# Patient Record
Sex: Female | Born: 1970
Health system: Southern US, Community
[De-identification: ages and names within clinical notes are randomized; demographics above are authoritative.]

## PROBLEM LIST (undated history)

## (undated) DIAGNOSIS — E7800 Pure hypercholesterolemia, unspecified: Secondary | ICD-10-CM

## (undated) DIAGNOSIS — I10 Essential (primary) hypertension: Secondary | ICD-10-CM

## (undated) DIAGNOSIS — J449 Chronic obstructive pulmonary disease, unspecified: Secondary | ICD-10-CM

## (undated) DIAGNOSIS — D649 Anemia, unspecified: Secondary | ICD-10-CM

## (undated) DIAGNOSIS — G4730 Sleep apnea, unspecified: Secondary | ICD-10-CM

## (undated) DIAGNOSIS — K219 Gastro-esophageal reflux disease without esophagitis: Secondary | ICD-10-CM

## (undated) DIAGNOSIS — E119 Type 2 diabetes mellitus without complications: Secondary | ICD-10-CM

## (undated) DIAGNOSIS — K635 Polyp of colon: Secondary | ICD-10-CM

## (undated) HISTORY — PX: OTHER SURGICAL HISTORY: SHX169

## (undated) HISTORY — PX: DILATION AND CURETTAGE OF UTERUS: SHX78

---

## 2004-07-16 ENCOUNTER — Emergency Department: Payer: Self-pay | Admitting: Unknown Physician Specialty

## 2007-12-02 ENCOUNTER — Emergency Department: Payer: Self-pay | Admitting: Emergency Medicine

## 2008-11-02 ENCOUNTER — Emergency Department: Payer: Self-pay | Admitting: Emergency Medicine

## 2008-11-03 ENCOUNTER — Emergency Department: Payer: Self-pay | Admitting: Emergency Medicine

## 2009-08-18 ENCOUNTER — Emergency Department: Payer: Self-pay | Admitting: Emergency Medicine

## 2009-08-26 ENCOUNTER — Emergency Department: Payer: Self-pay | Admitting: Emergency Medicine

## 2009-10-25 ENCOUNTER — Emergency Department: Payer: Self-pay | Admitting: Emergency Medicine

## 2011-07-14 ENCOUNTER — Emergency Department: Payer: Self-pay | Admitting: Emergency Medicine

## 2013-05-03 ENCOUNTER — Emergency Department: Payer: Self-pay | Admitting: Emergency Medicine

## 2013-05-03 LAB — BASIC METABOLIC PANEL
BUN: 18 mg/dL (ref 7–18)
Chloride: 107 mmol/L (ref 98–107)
EGFR (Non-African Amer.): >60
Sodium: 139 mmol/L (ref 136–145)

## 2013-05-03 LAB — CBC
HCT: 36.7 % (ref 35.0–47.0)
HGB: 12.2 g/dL (ref 12.0–16.0)
MCH: 29.2 pg (ref 26.0–34.0)
MCV: 88 fL (ref 80–100)
Platelet: 243 10*3/uL (ref 150–440)
RBC: 4.16 10*6/uL (ref 3.80–5.20)
RDW: 15.2 % — ABNORMAL HIGH (ref 11.5–14.5)
WBC: 13.5 10*3/uL — ABNORMAL HIGH (ref 3.6–11.0)

## 2013-05-03 LAB — TROPONIN I: Troponin-I: <0.02 ng/mL

## 2013-05-04 LAB — TROPONIN I: Troponin-I: <0.02 ng/mL

## 2014-09-19 ENCOUNTER — Emergency Department: Payer: Self-pay | Admitting: Emergency Medicine

## 2014-09-26 ENCOUNTER — Ambulatory Visit: Payer: Self-pay | Admitting: Obstetrics and Gynecology

## 2014-09-26 LAB — COMPREHENSIVE METABOLIC PANEL
ALBUMIN: 3.1 g/dL — AB (ref 3.4–5.0)
ALK PHOS: 84 U/L
Anion Gap: 4 — ABNORMAL LOW (ref 7–16)
BUN: 13 mg/dL (ref 7–18)
Bilirubin,Total: 0.2 mg/dL (ref 0.2–1.0)
CALCIUM: 8.7 mg/dL (ref 8.5–10.1)
CHLORIDE: 105 mmol/L (ref 98–107)
CO2: 31 mmol/L (ref 21–32)
Creatinine: 0.8 mg/dL (ref 0.60–1.30)
EGFR (African American): >60
EGFR (Non-African Amer.): >60
Glucose: 246 mg/dL — ABNORMAL HIGH (ref 65–99)
Osmolality: 288 (ref 275–301)
POTASSIUM: 3.8 mmol/L (ref 3.5–5.1)
SGOT(AST): 19 U/L (ref 15–37)
SGPT (ALT): 22 U/L
SODIUM: 140 mmol/L (ref 136–145)
Total Protein: 7.3 g/dL (ref 6.4–8.2)

## 2014-09-26 LAB — CBC
HCT: 35 % (ref 35.0–47.0)
HGB: 10.9 g/dL — AB (ref 12.0–16.0)
MCH: 27.5 pg (ref 26.0–34.0)
MCHC: 31.3 g/dL — ABNORMAL LOW (ref 32.0–36.0)
MCV: 88 fL (ref 80–100)
Platelet: 258 10*3/uL (ref 150–440)
RBC: 3.98 10*6/uL (ref 3.80–5.20)
RDW: 15.3 % — ABNORMAL HIGH (ref 11.5–14.5)
WBC: 12.9 10*3/uL — AB (ref 3.6–11.0)

## 2014-10-04 ENCOUNTER — Ambulatory Visit: Payer: Self-pay | Admitting: Obstetrics and Gynecology

## 2014-10-04 LAB — GC/CHLAMYDIA PROBE AMP

## 2015-01-14 LAB — SURGICAL PATHOLOGY

## 2015-01-20 NOTE — Op Note (Signed)
PATIENT NAME:  Barbara Pierce, Barbara Pierce MR#:  [id number] DATE OF BIRTH:  1970/11/26  DATE OF PROCEDURE:  10/04/2014  PREOPERATIVE DIAGNOSIS: Abnormal uterine bleeding, suspect endometrial polyp.   POSTOPERATIVE DIAGNOSIS:  Abnormal uterine bleeding, suspect endometrial polyp.   PROCEDURES:  1. Dilation and curettage.  2. Hysteroscopy.  3. Endometrial polypectomy using MyoSure device.   ANESTHESIA: General.   SURGEON: Prentice Docker, M.D.   ESTIMATED BLOOD LOSS: 25 mL.    OPERATIVE FLUIDS: 450 mL crystalloid.   URINE OUTPUT: Minimal (the patient voided prior to the procedure).   FLUID DEFICIT:  80 mL.    COMPLICATIONS: None.   FINDINGS:  Posterior uterine wall polypoid lesion approximately 2 cm, otherwise normal-appearing uterine cavity.   SPECIMENS:  1. Endometrial curettings.  2. Endometrial polypoid lesion.   CONDITION AT END OF PROCEDURE: Stable.   PROCEDURE IN DETAIL: The patient was taken to the Operating Room where general anesthesia was administered and found to be adequate. The patient was placed in the dorsal supine lithotomy position and prepped and draped in the usual sterile fashion.  After a timeout was called, in and out catheterization was performed for minimal return of urine. A sterile speculum was placed in the vagina and a single-tooth tenaculum was affixed to the anterior lip of the cervix. The cervix was dilated gently in a serial fashion to 6 mm using Hegar dilators. The MyoSure hysteroscope was gently introduced through the cervix into the uterine cavity with the above-noted findings. The MyoSure device was then attached to the MyoSure scope and the polypoid lesion was removed in its entirety. The hysteroscope was then removed and a gentle curettage was performed for global sampling throughout the uterus. The hysteroscope was then reintroduced into the uterine cavity to ensure hemostasis, which was verified. At this point, the procedure was terminated.   The patient  tolerated the procedure well. Sponge, lap, and needle counts were correct x2. For VTE prophylaxis, the patient was wearing pneumatic compression stockings throughout the entire procedure. She was awakened in the Operating Room and taken to the recovery area in stable condition.     ____________________________ Will Bonnet, MD sdj:by D: 10/04/2014 11:59:55 ET T: 10/04/2014 17:47:02 ET JOB#: [id number]  cc: Will Bonnet, MD, <Dictator> Will Bonnet MD ELECTRONICALLY SIGNED 10/24/2014 0:04

## 2015-10-25 ENCOUNTER — Emergency Department: Payer: 59

## 2015-10-25 ENCOUNTER — Emergency Department
Admission: EM | Admit: 2015-10-25 | Discharge: 2015-10-25 | Disposition: A | Discharge status: Home / Self Care | Payer: 59 | Attending: Emergency Medicine | Admitting: Emergency Medicine

## 2015-10-25 ENCOUNTER — Encounter: Payer: Self-pay | Admitting: Emergency Medicine

## 2015-10-25 DIAGNOSIS — F17210 Nicotine dependence, cigarettes, uncomplicated: Secondary | ICD-10-CM | POA: Diagnosis not present

## 2015-10-25 DIAGNOSIS — M940 Chondrocostal junction syndrome [Tietze]: Secondary | ICD-10-CM | POA: Insufficient documentation

## 2015-10-25 DIAGNOSIS — R079 Chest pain, unspecified: Secondary | ICD-10-CM | POA: Diagnosis present

## 2015-10-25 LAB — BASIC METABOLIC PANEL
ANION GAP: 6 (ref 5–15)
BUN: 14 mg/dL (ref 6–20)
CHLORIDE: 107 mmol/L (ref 101–111)
CO2: 25 mmol/L (ref 22–32)
Calcium: 8.6 mg/dL — ABNORMAL LOW (ref 8.9–10.3)
Creatinine, Ser: 0.7 mg/dL (ref 0.44–1.00)
GFR calc Af Amer: >60 mL/min (ref >60)
GFR calc non Af Amer: >60 mL/min (ref >60)
GLUCOSE: 110 mg/dL — AB (ref 65–99)
POTASSIUM: 3.7 mmol/L (ref 3.5–5.1)
Sodium: 138 mmol/L (ref 135–145)

## 2015-10-25 LAB — CBC
HEMATOCRIT: 37.9 % (ref 35.0–47.0)
HEMOGLOBIN: 12.5 g/dL (ref 12.0–16.0)
MCH: 29.4 pg (ref 26.0–34.0)
MCHC: 33 g/dL (ref 32.0–36.0)
MCV: 89.2 fL (ref 80.0–100.0)
Platelets: 216 10*3/uL (ref 150–440)
RBC: 4.25 MIL/uL (ref 3.80–5.20)
RDW: 14.7 % — ABNORMAL HIGH (ref 11.5–14.5)
WBC: 13.1 10*3/uL — ABNORMAL HIGH (ref 3.6–11.0)

## 2015-10-25 LAB — TROPONIN I
Troponin I: <0.03 ng/mL (ref <0.031)
Troponin I: <0.03 ng/mL (ref <0.031)

## 2015-10-25 LAB — FIBRIN DERIVATIVES D-DIMER (ARMC ONLY): FIBRIN DERIVATIVES D-DIMER (ARMC): 470 (ref 0–499)

## 2015-10-25 MED ORDER — ASPIRIN 81 MG PO CHEW
324.0000 mg | CHEWABLE_TABLET | Freq: Once | ORAL | Status: AC
  Administered 2015-10-25: 324 mg via ORAL
  Filled 2015-10-25: qty 4

## 2015-10-25 MED ORDER — PSEUDOEPHEDRINE HCL 30 MG PO TABS: 30.0000 mg | ORAL_TABLET | Freq: Once | ORAL | Status: DC

## 2015-10-25 NOTE — ED Notes (Signed)
Report taken from [place], [place]. Will repeat troponin at 0730.

## 2015-10-25 NOTE — Discharge Instructions (Signed)

## 2015-10-25 NOTE — ED Provider Notes (Signed)
[facility] Emergency Department Provider Note  ____________________________________________  Time seen: 5:45 AM  I have reviewed the triage vital signs and the nursing notes.   HISTORY  Chief Complaint Chest Pain      HPI Barbara Pierce is a 45 y.o. female presents with left parasternal chest pain and described as sharp since yesterday. Patient denies any shortness of breath noted Lotrisone the pain or swelling. Patient denies any diaphoresis or dyspnea. Patient denies any cough. Patient admits to tobacco use daily. Patient denies any aggravating or alleviating factors. Patient denies any family history of myocardial infarction. However the patient admits that her mother does have congestive heart failure and has had a DVT in the past as well.   Past medical history Bronchitis There are no active problems to display for this patient.   Past surgical history None No current outpatient prescriptions on file.  Allergies Review of patient's allergies indicates no known allergies.  No family history on file.  Social History Social History  Substance Use Topics  . Smoking status: Current Every Day Smoker -- 0.50 packs/day    Types: Cigarettes  . Smokeless tobacco: None  . Alcohol Use: No    Review of Systems  Constitutional: Negative for fever. Eyes: Negative for visual changes. ENT: Negative for sore throat. Cardiovascular: Positive for chest pain. Respiratory: Negative for shortness of breath. Gastrointestinal: Negative for abdominal pain, vomiting and diarrhea. Genitourinary: Negative for dysuria. Musculoskeletal: Negative for back pain. Skin: Negative for rash. Neurological: Negative for headaches, focal weakness or numbness.   10-point ROS otherwise negative.  ____________________________________________   PHYSICAL EXAM:  VITAL SIGNS: ED Triage Vitals  Enc Vitals Group     BP 10/25/15 0420 144/80 mmHg     Pulse Rate 10/25/15  0420 79     Resp 10/25/15 0420 20     Temp 10/25/15 0420 98 F (36.7 C)     Temp Source 10/25/15 0420 Oral     SpO2 10/25/15 0420 97 %     Weight 10/25/15 0420 211 lb (95.709 kg)     Height 10/25/15 0420 4\' 9"  (1.448 m)     Head Cir --      Peak Flow --      Pain Score 10/25/15 0420 6     Pain Loc --      Pain Edu? --      Excl. in [place]? --      Constitutional: Alert and oriented. Well appearing and in no distress. Eyes: Conjunctivae are normal. PERRL. Normal extraocular movements. ENT   Head: Normocephalic and atraumatic.   Nose: No congestion/rhinnorhea.   Mouth/Throat: Mucous membranes are moist.   Neck: No stridor. Hematological/Lymphatic/Immunilogical: No cervical lymphadenopathy. Cardiovascular: Normal rate, regular rhythm. Normal and symmetric distal pulses are present in all extremities. No murmurs, rubs, or gallops. Left costosternal pain with palpation Respiratory: Normal respiratory effort without tachypnea nor retractions. Breath sounds are clear and equal bilaterally. No wheezes/rales/rhonchi. Gastrointestinal: Soft and nontender. No distention. There is no CVA tenderness. Genitourinary: deferred Musculoskeletal: Nontender with normal range of motion in all extremities. No joint effusions.  No lower extremity tenderness nor edema. Neurologic:  Normal speech and language. No gross focal neurologic deficits are appreciated. Speech is normal.  Skin:  Skin is warm, dry and intact. No rash noted. Psychiatric: Mood and affect are normal. Speech and behavior are normal. Patient exhibits appropriate insight and judgment.  ____________________________________________    LABS (pertinent positives/negatives)  Labs Reviewed  BASIC METABOLIC PANEL -  Abnormal; Notable for the following:    Glucose, Bld 110 (*)    Calcium 8.6 (*)    All other components within normal limits  CBC - Abnormal; Notable for the following:    WBC 13.1 (*)    RDW 14.7 (*)    All other  components within normal limits  TROPONIN I  FIBRIN DERIVATIVES D-DIMER ([facility] ONLY)     ____________________________________________   EKG  ED ECG REPORT I, BROWN, [place] N, the attending physician, personally viewed and interpreted this ECG.   Date: 10/25/2015  EKG Time: 4:26 AM  Rate: 70  Rhythm: Normal Sinus rhythm  Axis: None  Intervals:None  ST&T Change: Normal   ____________________________________________    RADIOLOGY     DG Chest 2 View (Final result) Result time: 10/25/15 04:41:09   Final result by Rad Results In Interface (10/25/15 04:41:09)   Narrative:   CLINICAL DATA: Mid chest pain beginning yesterday. Similar symptoms previously associated with bronchitis.  EXAM: CHEST 2 VIEW  COMPARISON: Chest radiograph September 19, 2014  FINDINGS: Cardiomediastinal silhouette is normal. The lungs are clear without pleural effusions or focal consolidations. Trachea projects midline and there is no pneumothorax. Soft tissue planes and included osseous structures are non-suspicious.  IMPRESSION: Normal chest.   Electronically Signed By: Elon Alas M.D. On: 10/25/2015 04:41       INITIAL IMPRESSION / ASSESSMENT AND PLAN / ED COURSE  Pertinent labs & imaging results that were available during my care of the patient were reviewed by me and considered in my medical decision making (see chart for details).  EKG revealed no evidence of ischemia or infarction, cardiac enzymes negative, d-dimer negative in [facility] 0 patient  ____________________________________________   FINAL CLINICAL IMPRESSION(S) / ED DIAGNOSES  Final diagnoses:  Costochondritis, acute      Gregor Hams, MD 10/25/15 [phone]

## 2015-10-25 NOTE — ED Notes (Signed)
Patient ambulatory to triage with steady gait, without difficulty or distress noted; pt reports since yesterday having mid CP with no accomp symptoms, no radiating pain; st hx of same with bronchitis; st "feels like something is stuck"

## 2016-03-29 ENCOUNTER — Encounter: Payer: Self-pay | Admitting: Emergency Medicine

## 2016-03-29 ENCOUNTER — Emergency Department
Admission: EM | Admit: 2016-03-29 | Discharge: 2016-03-29 | Disposition: A | Discharge status: Home / Self Care | Payer: 59 | Attending: Emergency Medicine | Admitting: Emergency Medicine

## 2016-03-29 ENCOUNTER — Emergency Department: Payer: 59

## 2016-03-29 DIAGNOSIS — S82121A Displaced fracture of lateral condyle of right tibia, initial encounter for closed fracture: Secondary | ICD-10-CM | POA: Diagnosis not present

## 2016-03-29 DIAGNOSIS — X501XXA Overexertion from prolonged static or awkward postures, initial encounter: Secondary | ICD-10-CM | POA: Insufficient documentation

## 2016-03-29 DIAGNOSIS — M25561 Pain in right knee: Secondary | ICD-10-CM | POA: Diagnosis present

## 2016-03-29 DIAGNOSIS — Y999 Unspecified external cause status: Secondary | ICD-10-CM | POA: Diagnosis not present

## 2016-03-29 DIAGNOSIS — F17210 Nicotine dependence, cigarettes, uncomplicated: Secondary | ICD-10-CM | POA: Diagnosis not present

## 2016-03-29 DIAGNOSIS — S83421A Sprain of lateral collateral ligament of right knee, initial encounter: Secondary | ICD-10-CM | POA: Insufficient documentation

## 2016-03-29 DIAGNOSIS — Y929 Unspecified place or not applicable: Secondary | ICD-10-CM | POA: Diagnosis not present

## 2016-03-29 DIAGNOSIS — Y9339 Activity, other involving climbing, rappelling and jumping off: Secondary | ICD-10-CM | POA: Diagnosis not present

## 2016-03-29 MED ORDER — NAPROXEN 500 MG PO TBEC: 500.0000 mg | DELAYED_RELEASE_TABLET | Freq: Two times a day (BID) | ORAL | Status: DC

## 2016-03-29 NOTE — ED Notes (Signed)
Pt c/o rt knee pain - no swelling or redness noted. Pt able to ambulate

## 2016-03-29 NOTE — ED Notes (Signed)
Patient presents to the ED with right knee pain since yesterday afternoon.  Patient states she was jumping in a bounce house and then when she tried to get out of the bounce house, patient states she felt like her knee, "when the wrong way".  Patient reports hearing a "pop".  Patient limping to triage.  Patient is in no obvious distress at this time.

## 2016-03-29 NOTE — ED Provider Notes (Signed)
[facility] Emergency Department Provider Note ____________________________________________  Time seen: 1523  I have reviewed the triage vital signs and the nursing notes.  HISTORY  Chief Complaint  Knee Pain  HPI Barbara Pierce is a 45 y.o. female since to the ED for evaluation of pain to the right knee with onset yesterday afternoon. The patient describes she was playing in a sounds house, and when she went to get out of the bounce house she felt like her knee "went the wrong way."  She describes her knee going inwards as her lower leg went outward. She denies an outright fall onto the knee. She claims hearing a pop at the time of the injury. She reports since that time she's had instability with walking.She rated the pain to her right knee at an 8/10 in triage, but denies any significant pain with walking during the interview.   History reviewed. No pertinent past medical history.  There are no active problems to display for this patient.  History reviewed. No pertinent past surgical history.  Current Outpatient Rx  Name  Route  Sig  Dispense  Refill  . naproxen (EC NAPROSYN) 500 MG EC tablet   Oral   Take 1 tablet (500 mg total) by mouth 2 (two) times daily with a meal.   30 tablet   0    Allergies Review of patient's allergies indicates no known allergies.  No family history on file.  Social History Social History  Substance Use Topics  . Smoking status: Current Every Day Smoker -- 0.50 packs/day    Types: Cigarettes  . Smokeless tobacco: None  . Alcohol Use: No   Review of Systems  Constitutional: Negative for fever. Cardiovascular: Negative for chest pain. Respiratory: Negative for shortness of breath. Gastrointestinal: Negative for abdominal pain, vomiting and diarrhea. Musculoskeletal: Negative for back pain. Right knee pain as above Skin: Negative for rash. Neurological: Negative for headaches, focal weakness or  numbness. ____________________________________________  PHYSICAL EXAM:  VITAL SIGNS: ED Triage Vitals  Enc Vitals Group     BP 03/29/16 1447 159/90 mmHg     Pulse Rate 03/29/16 1447 90     Resp 03/29/16 1447 17     Temp 03/29/16 1447 98 F (36.7 C)     Temp Source 03/29/16 1447 Oral     SpO2 03/29/16 1447 96 %     Weight 03/29/16 1447 215 lb (97.523 kg)     Height 03/29/16 1447 4\' 9"  (1.448 m)     Head Cir --      Peak Flow --      Pain Score 03/29/16 1448 8     Pain Loc --      Pain Edu? --      Excl. in [place]? --    Constitutional: Alert and oriented. Well appearing and in no distress. Head: Normocephalic and atraumatic. Cardiovascular: Normal Distal pulses and capillary refill. Respiratory: Normal respiratory effort.  Musculoskeletal: Right knee without obvious deformity, effusion, or dislocation. Patient evidently normal flexion and extension range exam. She is moderately tender to palpation to the lateral posterior aspect of the knee. No popliteal space fullness or calf/Achilles tenderness is appreciated. Nontender with normal range of motion in all extremities.  Neurologic:  Antalgic gait without ataxia. Normal speech and language. No gross focal neurologic deficits are appreciated. Skin:  Skin is warm, dry and intact. No rash noted. ____________________________________________   RADIOLOGY  Right Knee IMPRESSION: 1. Segond fracture. This has a high association with ACL  injury, and given the presence of a joint effusion, underlying soft tissue injury is likely and could be better evaluated with followup nonemergent knee MRI if clinically appropriate.  I, Amonte Brookover, Dannielle Karvonen, personally viewed and evaluated these images (plain radiographs) as part of my medical decision making, as well as reviewing the written report by the radiologist. ____________________________________________  PROCEDURES  Ace wrap Knee  immobilizer ____________________________________________  INITIAL IMPRESSION / ASSESSMENT AND PLAN / ED COURSE  Patient was initial fracture care provided for a closed stable Segond fracture of the right knee. She is fitted with an Ace wrap and knee immobilizer for support. She is provided with a prescription for EC Naprosyn for pain relief. She will follow-up with Dr. Earnestine Leys for ongoing fracture management as discussed. 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 ____________________________________________   FINAL CLINICAL IMPRESSION(S) / ED DIAGNOSES  Final diagnoses:  Closed fracture of lateral portion of right tibial plateau, initial encounter  Knee LCL sprain, right, initial encounter     Melvenia Needles, PA-C 03/29/16 1642  Barbara Listen, MD 03/29/16 2150

## 2016-03-29 NOTE — Discharge Instructions (Signed)
Your exam and x-ray reveal an avulsion fracture to the outside of the knee. You may also have a tear or sprain of the ACL within the knee joint. You should wear the knee wrap and immobilizer when walking, working, or standing. You should take the anti-inflammatory as needed. Follow-up with Dr. Sabra Heck in the next week or so for re-evaluation. Rest with the leg elevated and iced when seated.   Combined Knee Ligament Sprain Combined knee ligament sprain is a tear of more than one of the major ligaments of the knee. The four knee ligaments are the anterior cruciate ligament (ACL), posterior cruciate ligament (PCL), medial collateral ligament (MCL) and lateral collateral ligament (LCL). Ligaments connect bones. They often cross a joint to hold the bones together. The ligaments of the knee keep the thigh bone (femur) and shinbone (tibia) in alignment. These ligaments allow the joint to move within a certain range of motion. Movement outside this range causes a ligament strain. Injury to multiple ligaments at the same time results in difficulty playing sports and in daily living. The most common multiple knee ligament injury involves the ACL and MCL. SYMPTOMS   A "popping" sound heard or felt at the time of injury.  Inability to continue activity after injury.  Inflammation of the knee within 6 hours after injury.  Possibly, deformity of the knee.  Inability to straighten the knee.  Feeling of the knee giving way or buckling.  Sometimes, locking of the knee, if the joint cartilage (meniscus) is injured.  Rarely, numbness, weakness, paralysis, discoloration, or coldness, due to nerve or blood vessel injury. CAUSES  Spraining of multiple ligaments occurs when a force is placed on the ligaments that exceeds their strength. This is often caused by a direct hit (trauma). It may also be caused by a non-contact injury (hyperextending the knee while twisting it).  RISK INCREASES WITH:  Contact sports  (football, rugby, lacrosse). Sports that involve pivoting, jumping, cutting, or changing direction (basketball, gymnastics, soccer, volleyball). Sports on uneven ground (cross-country running, soccer).  Poor strength and/or flexibility.  Improper fitted or padded equipment. PREVENTION  Warm up and stretch properly before activity.  Maintain physical fitness:  Thigh, leg, and knee flexibility.  Muscle strength and endurance.  Learn and use proper exercise technique.  Wear proper and well fitting equipment (correct length of cleats for surface). PROGNOSIS  Without treatment, the knee will continue to give way and become vulnerable to recurring injury. Recurring injury can happen during athletics or daily living. If the injury includes damage to a nerve or artery, the chance of a poor outcome increases. Surgery is often needed to regain stability of the knee. RELATED COMPLICATIONS  Frequently recurring symptoms, including:  Knee giving way.  Joint instability.  Inflammation.  Injury to the joint cartilage (meniscus). This may result in locking and/or swelling of the knee.  Injury to joint (articular) cartilage of the thigh bone or shinbone. This may result in arthritis of the knee.  Injury to other ligaments of the knee.  Knee stiffness (loss of knee motion).  Permanent injury to nerves (numbness, weakness, or paralysis) or arteries.  Removal (amputation) of the leg, due to nerve or artery injury. TREATMENT  Treatment first involves medicine and ice, to reduce pain and inflammation. Crutches may be advised, to decrease pain while walking. The knee may be restrained. Rehabilitation focuses on reducing swelling, regaining range of motion, and regaining muscle control and strength. It may also include receiving proper use training, wearing a  brace, and education. (Avoid sports that involve pivoting, cutting, changing direction, jumping and landing). Surgery often offers the best  chance for full recovery. Surgery from combined ACL/MCL injury involves replacement (reconstruction) of the ACL. This also allows for MCL healing. Despite surgery, some athletes may never return to their prior level of competition. The ability to return to sports depends on the related injuries and demands of the sport.  MEDICATION   If pain medicine is needed, nonsteroidal anti-inflammatory medicines (aspirin and ibuprofen), or other minor pain relievers (acetaminophen), are often advised.  Do not take pain medicine for 7 days before surgery.  Stronger pain relievers may be prescribed. Use only as directed and only as much as you need.  Contact your caregiver immediately if any bleeding, stomach upset, or signs of an allergic reaction occur. COLD THERAPY  Cold treatment (icing) should be applied for 10 to 15 minutes every 2 to 3 hours for inflammation and pain, and immediately after activity that aggravates your symptoms. Use ice packs or an ice massage. SEEK MEDICAL CARE IF:   Symptoms get worse or do not improve in 6 weeks, despite treatment.  After injury or surgery, any of the following occur:  Pain, numbness, coldness, or a blue, gray, or dark color occurs in the foot or toenails.  Increased pain, swelling, redness, drainage of fluids, or bleeding in the affected area.  Signs of infection (headache, muscle aches, dizziness, or a general ill feeling with fever).  New, unexplained symptoms develop. (Drugs used in treatment may produce side effects.)   This information is not intended to replace advice given to you by your health care provider. Make sure you discuss any questions you have with your health care provider.   Document Released: 09/07/2005 Document Revised: 11/30/2011 Document Reviewed: 11/09/2014 Elsevier Interactive Patient Education 2016 [employer]  Knee Fracture, Adult A knee fracture is a break in a bone of the knee. The break may be in the kneecap (patella),  the lower part of the thigh bone (femur), or the upper part of the shin bone (tibia). There are several types of fractures. They include:  Stable. In this type of fracture, the bones of the knee remain in place after the break.  Displaced. In this type of fracture, the bones no longer line up after the break.  Comminuted. In this type of fracture, the bone breaks into several pieces.  Open. In this type of fracture, the broken bone comes through the skin. CAUSES This injury is usually caused by a fall. This injury can happen because of the impact of the fall or from a violent contraction of the leg muscles before you hit the ground. It can also result from a car accident or a collision with a hard surface. RISK FACTORS This injury is more likely to develop in people who:  Are female.  Are 22-11 years old.  Participate in high-energy sports.  Have a condition that weakens the bones, such as osteoporosis.  Have had a knee replacement. SYMPTOMS Symptoms of this injury include:  Pain.  Swelling.  Bruising.  Inability to bend your knee.  Misshapen knee.  Inability to walk.  Inability to use your injured leg to support your body weight. DIAGNOSIS This injury is diagnosed with a physical exam. Your health care provider may also order:  Imaging studies, such as an X-ray, CT scan, MRI scan, or ultrasound.  A procedure called arthroscopy to view the inside of your knee with a small camera. TREATMENT Treatment  for this injury may involve:  Wearing a splint until swelling goes down.  Wearing a cast to keep the fractured bone from moving while it heals. A cast is usually put on after swelling has gone down.  Surgery to move a bone back into place. HOME CARE INSTRUCTIONS If You Have a Splint:  Wear it as directed by your health care provider. Remove it only as directed by your health care provider.  Loosen the splint if your toes become numb and tingle, or if they turn cold  and blue.  Do not put pressure on any part of your splint. If You Have a Cast:  Do not stick anything inside the cast to scratch your skin. Doing that increases your risk of infection.  Check the skin around the cast every day. Report any concerns to your health care provider. You may put lotion on dry skin around the edges of the cast. Do not apply lotion to the skin underneath the cast. Bathing  Cover the cast or splint with a watertight plastic bag to protect it from water while you take a bath or a shower. Do not let the cast or splint get wet. Managing Pain, Stiffness, and Swelling  If directed, apply ice to the injured area:  Put ice in a plastic bag.  Place a towel between your skin and the bag.  Leave the ice on for 20 minutes, 2-3 times a day.  Move your toes often to avoid stiffness and to lessen swelling.  Raise the injured area above the level of your heart while you are lying down. Driving  Do not drive or operate heavy machinery while taking pain medicine.  Do not drive while wearing a cast or splint on a hand or foot that you use for driving. Activity  Return to your normal activities as directed by your health care provider. Ask your health care provider what activities are safe for you. General Instructions  Do not put pressure on any part of the cast or splint until it is fully hardened. This may take several hours.  Keep the cast or splint clean and dry.  Do not use any tobacco products, including cigarettes, chewing tobacco, or electronic cigarettes. Tobacco can delay bone healing. If you need help quitting, ask your health care provider.  Take medicines only as directed by your health care provider.  Keep all follow-up visits as directed by your health care provider. This is important. SEEK MEDICAL CARE IF:  You have knee pain and swelling.  You have trouble walking.  Your cast becomes wet or damaged or suddenly feels too tight. SEEK IMMEDIATE  MEDICAL CARE IF:  Your pain and swelling get worse.  You have severe pain below the fracture.  Your skin or toenails turn blue or gray, feel cold, or become numb.  You have fluid, blood, or pus coming from under your cast.   This information is not intended to replace advice given to you by your health care provider. Make sure you discuss any questions you have with your health care provider.   Document Released: 07/21/2006 Document Revised: 09/28/2014 Document Reviewed: 05/09/2014 Elsevier Interactive Patient Education [employer].

## 2016-03-29 NOTE — ED Notes (Signed)
Ace wrap applied as ordered

## 2016-08-04 ENCOUNTER — Emergency Department
Admission: EM | Admit: 2016-08-04 | Discharge: 2016-08-04 | Disposition: A | Discharge status: Home / Self Care | Payer: Managed Care, Other (non HMO) | Attending: Emergency Medicine | Admitting: Emergency Medicine

## 2016-08-04 ENCOUNTER — Emergency Department: Payer: Managed Care, Other (non HMO)

## 2016-08-04 ENCOUNTER — Encounter: Payer: Self-pay | Admitting: Emergency Medicine

## 2016-08-04 DIAGNOSIS — G8929 Other chronic pain: Secondary | ICD-10-CM | POA: Insufficient documentation

## 2016-08-04 DIAGNOSIS — E119 Type 2 diabetes mellitus without complications: Secondary | ICD-10-CM | POA: Diagnosis not present

## 2016-08-04 DIAGNOSIS — M25511 Pain in right shoulder: Secondary | ICD-10-CM

## 2016-08-04 DIAGNOSIS — I10 Essential (primary) hypertension: Secondary | ICD-10-CM | POA: Diagnosis not present

## 2016-08-04 DIAGNOSIS — M19011 Primary osteoarthritis, right shoulder: Secondary | ICD-10-CM | POA: Diagnosis not present

## 2016-08-04 DIAGNOSIS — F17210 Nicotine dependence, cigarettes, uncomplicated: Secondary | ICD-10-CM | POA: Insufficient documentation

## 2016-08-04 HISTORY — DX: Type 2 diabetes mellitus without complications: E11.9

## 2016-08-04 HISTORY — DX: Essential (primary) hypertension: I10

## 2016-08-04 HISTORY — DX: Pure hypercholesterolemia, unspecified: E78.00

## 2016-08-04 MED ORDER — HYDROCODONE-ACETAMINOPHEN 5-325 MG PO TABS
1.0000 | ORAL_TABLET | ORAL | 0 refills | Status: DC | PRN
Start: 2016-08-04 — End: 2017-07-08

## 2016-08-04 MED ORDER — MELOXICAM 7.5 MG PO TABS: 7.5000 mg | ORAL_TABLET | Freq: Every day | ORAL | 2 refills | Status: DC

## 2016-08-04 NOTE — ED Provider Notes (Signed)
[facility] Emergency Department Provider Note   ____________________________________________   First MD Initiated Contact with Patient 08/04/16 1459     (approximate)  I have reviewed the triage vital signs and the nursing notes.   HISTORY  Chief Complaint Arm Pain   HPI Barbara Pierce is a 45 y.o. female is here complaining of right shoulder pain. Patient states that her pain increased yesterday and reports that it is severe pain and aching today. Patient left work to come to the emergency room for her pain. She states she has taken some over-the-counter medication without any relief. She states that she is unable to lift her arm above her head without severe pain. She also has history of chronic pain with shoulder pain for more than 20 years. Patient states she is not currently seeing anyone for this. She states that her PCP is at [facility] clinic but she is also not seen them for shoulder pain as well. She has a history of multiple surgeries to her right arm that was done at [facility] but has not followed up with anyone and the surgeries were "long time ago".Currently she rates her pain as a 10 over 10.   Past Medical History:  Diagnosis Date  . Diabetes mellitus without complication ([place])   . High cholesterol   . Hypertension     There are no active problems to display for this patient.   History reviewed. No pertinent surgical history.  Prior to Admission medications   Medication Sig Start Date End Date Taking? Authorizing Provider  HYDROcodone-acetaminophen (NORCO/VICODIN) 5-325 MG tablet Take 1 tablet by mouth every 4 (four) hours as needed for moderate pain. 08/04/16   Johnn Hai, PA-C  meloxicam (MOBIC) 7.5 MG tablet Take 1 tablet (7.5 mg total) by mouth daily. 08/04/16 08/04/17  Johnn Hai, PA-C    Allergies Patient has no known allergies.  History reviewed. No pertinent family history.  Social History Social History    Substance Use Topics  . Smoking status: Current Every Day Smoker    Packs/day: 0.50    Types: Cigarettes  . Smokeless tobacco: Never Used  . Alcohol use No    Review of Systems Constitutional: No fever/chills Cardiovascular: Denies chest pain. Respiratory: Denies shortness of breath. Gastrointestinal:   No nausea, no vomiting.  Musculoskeletal: Negative for cervical pain. Positive for right shoulder pain. Skin: Negative for rash. Neurological: Negative for headaches, focal weakness or numbness.  10-point ROS otherwise negative.  ____________________________________________   PHYSICAL EXAM:  VITAL SIGNS: ED Triage Vitals  Enc Vitals Group     BP 08/04/16 1328 (!) 148/84     Pulse Rate 08/04/16 1328 90     Resp 08/04/16 1328 18     Temp 08/04/16 1328 97.5 F (36.4 C)     Temp Source 08/04/16 1328 Oral     SpO2 08/04/16 1328 100 %     Weight 08/04/16 1329 215 lb (97.5 kg)     Height --      Head Circumference --      Peak Flow --      Pain Score 08/04/16 1329 10     Pain Loc --      Pain Edu? --      Excl. in [place]? --     Constitutional: Alert and oriented. Well appearing and in no acute distress. Eyes: Conjunctivae are normal. PERRL. EOMI. Head: Atraumatic. Nose: No congestion/rhinnorhea. Mouth/Throat: Mucous membranes are moist.  Oropharynx non-erythematous. Neck: No  stridor.  No point tenderness on palpation of cervical spine posteriorly. Range of motion is without restriction. Hematological/Lymphatic/Immunilogical: No cervical lymphadenopathy. Cardiovascular: Normal rate, regular rhythm. Grossly normal heart sounds.  Good peripheral circulation. Respiratory: Normal respiratory effort.  No retractions. Lungs CTAB. Gastrointestinal: Soft and nontender. No distention.  Musculoskeletal: Right shoulder no gross deformity was noted. No soft tissue swelling or erythema was noted. There is no crepitus with range of motion however range of motion is restricted in all 4  planes secondary to pain. Motor sensory function distally is intact. Pulses bilaterally are equal. Neurologic:  Normal speech and language. No gross focal neurologic deficits are appreciated. No gait instability. Skin:  Skin is warm, dry and intact. No rash noted. Psychiatric: Mood and affect are normal. Speech and behavior are normal.  ____________________________________________   LABS (all labs ordered are listed, but only abnormal results are displayed)  Labs Reviewed - No data to display  RADIOLOGY  Right shoulder x-ray per radiologist: Mild AC joint degenerative changes I, Johnn Hai, personally viewed and evaluated these images (plain radiographs) as part of my medical decision making, as well as reviewing the written report by the radiologist.  ____________________________________________   PROCEDURES  Procedure(s) performed: None  Procedures  Critical Care performed: No  ____________________________________________   INITIAL IMPRESSION / ASSESSMENT AND PLAN / ED COURSE  Pertinent labs & imaging results that were available during my care of the patient were reviewed by me and considered in my medical decision making (see chart for details).    Clinical Course    Patient was started on meloxicam 7.5 mg one daily with food. She is also given prescription for Norco as needed for severe pain #15 no refill. Patient is to follow-up with her primary care doctor at [facility] clinic or Dr. Sabra Heck who is the orthopedist on call. She is continue taking her regular medication including her blood pressure medication on a regular basis. She may also use ice or heat to her shoulder as needed for comfort.  ____________________________________________   FINAL CLINICAL IMPRESSION(S) / ED DIAGNOSES  Final diagnoses:  Chronic right shoulder pain  Osteoarthritis of right shoulder, unspecified osteoarthritis type      NEW MEDICATIONS STARTED DURING THIS VISIT:  Discharge  Medication List as of 08/04/2016  3:12 PM    START taking these medications   Details  HYDROcodone-acetaminophen (NORCO/VICODIN) 5-325 MG tablet Take 1 tablet by mouth every 4 (four) hours as needed for moderate pain., Starting Tue 08/04/2016, Print    meloxicam (MOBIC) 7.5 MG tablet Take 1 tablet (7.5 mg total) by mouth daily., Starting Tue 08/04/2016, Until Wed 08/04/2017, Print         Note:  This document was prepared using Dragon voice recognition software and may include unintentional dictation errors.    Johnn Hai, PA-C 08/04/16 [place], MD 08/04/16 [id number]

## 2016-08-04 NOTE — ED Triage Notes (Signed)
Pt to ed with c/o right shoulder and right arm pain.  Pt states started yesterday, reports severe pain and aching.  Decreased ROM.  Pt states she is unable to lift arm above her head.  Denies injury

## 2016-08-04 NOTE — Discharge Instructions (Signed)
Patient taking meloxicam daily with food. Norco as needed for severe pain. You cannot drive while taking this medication. You should follow up with your primary care doctor at [facility] clinic if any continued problems. You may also follow-up with Dr. Sabra Heck who is the orthopedist on call. You  will need to call and make an appointment with him to be seen.

## 2016-08-05 DIAGNOSIS — M7540 Impingement syndrome of unspecified shoulder: Secondary | ICD-10-CM | POA: Insufficient documentation

## 2016-12-28 ENCOUNTER — Encounter (INDEPENDENT_AMBULATORY_CARE_PROVIDER_SITE_OTHER): Payer: Self-pay

## 2016-12-28 ENCOUNTER — Other Ambulatory Visit: Payer: Self-pay

## 2016-12-28 ENCOUNTER — Telehealth: Payer: Self-pay

## 2016-12-28 ENCOUNTER — Encounter: Payer: Self-pay | Admitting: Gastroenterology

## 2016-12-28 ENCOUNTER — Ambulatory Visit (INDEPENDENT_AMBULATORY_CARE_PROVIDER_SITE_OTHER): Payer: Managed Care, Other (non HMO) | Admitting: Gastroenterology

## 2016-12-28 VITALS — BP 123/83 | HR 97 | Temp 98.1°F | Ht <= 58 in | Wt 218.0 lb

## 2016-12-28 DIAGNOSIS — Z86010 Personal history of colonic polyps: Secondary | ICD-10-CM

## 2016-12-28 DIAGNOSIS — K625 Hemorrhage of anus and rectum: Secondary | ICD-10-CM | POA: Diagnosis not present

## 2016-12-28 NOTE — Progress Notes (Signed)
Gastroenterology Consultation  Referring Provider:     Cletis Athens, MD Primary Care Physician:  [facility] Primary Gastroenterologist:  Dr. [name]  Reason for Consultation:     Rectal bleeding         HPI:   Barbara Pierce is a 46 y.o. y/o female referred for consultation & management  by Dr. Nicki Reaper [facility].    10/2016-Hb 12.5    Rectal bleeding :  Onset and where was blood seen  :some years , blood on the stools,  Frequency of bowel movements :almost every day , blood seen on stool when she has hard stool, sometimes occurs on soft stool as well  Consistency : depending on eating changes  Change in shape of stool:no change  Pain associated with bowel movements:no  Blood thinner usage:no  NSAID's: no  Prior colonoscopy :2006- had one polyp taken out, had colonoscopy for rectal bleeding  Family history of colon cancer or polyps:her mother had 7 colon polyps.  Weight loss:5 lbs since she is trying to lose weight.     Past Medical History:  Diagnosis Date  . Diabetes mellitus without complication ([place])   . High cholesterol   . Hypertension     No past surgical history on file.  Prior to Admission medications   Medication Sig Start Date End Date Taking? Authorizing Provider  albuterol (PROVENTIL HFA;VENTOLIN HFA) 108 (90 Base) MCG/ACT inhaler Inhale into the lungs. 10/01/16  Yes Historical Provider, MD  atorvastatin (LIPITOR) 20 MG tablet  12/07/16  Yes Historical Provider, MD  lisinopril (PRINIVIL,ZESTRIL) 40 MG tablet  12/07/16  Yes Historical Provider, MD  metFORMIN (GLUCOPHAGE) 500 MG tablet  12/07/16  Yes Historical Provider, MD  HYDROcodone-acetaminophen (NORCO/VICODIN) 5-325 MG tablet Take 1 tablet by mouth every 4 (four) hours as needed for moderate pain. Patient not taking: Reported on 12/28/2016 08/04/16   Johnn Hai, PA-C  meloxicam (MOBIC) 7.5 MG tablet Take 1 tablet (7.5 mg total) by mouth daily. Patient not taking:  Reported on 12/28/2016 08/04/16 08/04/17  Johnn Hai, PA-C    No family history on file.   Social History  Substance Use Topics  . Smoking status: Current Every Day Smoker    Packs/day: 0.50    Types: Cigarettes  . Smokeless tobacco: Never Used  . Alcohol use No    Allergies as of 12/28/2016  . (No Known Allergies)    Review of Systems:    All systems reviewed and negative except where noted in HPI.   Physical Exam:  BP 123/83 (BP Location: Left Arm, Patient Position: Sitting, Cuff Size: Large)   Pulse 97   Temp 98.1 F (36.7 C) (Oral)   Ht 4' 9.5" (1.461 m)   Wt 218 lb (98.9 kg)   BMI 46.36 kg/m  No LMP recorded. Psych:  Alert and cooperative. Normal mood and affect. General:   Alert,  Well-developed, well-nourished, pleasant and cooperative in NAD Head:  Normocephalic and atraumatic. Eyes:  Sclera clear, no icterus.   Conjunctiva pink. Ears:  Normal auditory acuity. Nose:  No deformity, discharge, or lesions. Mouth:  No deformity or lesions,oropharynx pink & moist. Neck:  Supple; no masses or thyromegaly. Lungs:  Respirations even and unlabored.  Clear throughout to auscultation.   No wheezes, crackles, or rhonchi. No acute distress. Heart:  Regular rate and rhythm; no murmurs, clicks, rubs, or gallops. Abdomen:  Normal bowel sounds.  No bruits.  Soft, non-tender and non-distended without masses, hepatosplenomegaly or hernias  noted.  No guarding or rebound tenderness.    Extremities:  No clubbing or edema.  No cyanosis. Neurologic:  Alert and oriented x3;  grossly normal neurologically. Psych:  Alert and cooperative. Normal mood and affect.  Imaging Studies: No results found.  Assessment and Plan:   Barbara Pierce is a 46 y.o. y/o female has been referred for rectal bleeding .   Plan  1. Colonoscopy    I have discussed alternative options, risks & benefits,  which include, but are not limited to, bleeding, infection, perforation,respiratory complication  & drug reaction.  The patient agrees with this plan & written consent will be obtained  Follow upas needed  Dr [name] MD

## 2016-12-28 NOTE — Telephone Encounter (Signed)
Gastroenterology Pre-Procedure Review  Request Date: 01/19/17 Requesting Physician: Dr. Vicente Males  PATIENT REVIEW QUESTIONS: The patient responded to the following health history questions as indicated:    1. Are you having any GI issues? Yes (blood in stool) 2. Do you have a personal history of Polyps? yes (not removed) 3. Do you have a family history of Colon Cancer or Polyps? yes (mother: polyps) 4. Diabetes Mellitus? yes (type II) 5. Joint replacements in the past 12 months?no 6. Major health problems in the past 3 months?no 7. Any artificial heart valves, MVP, or defibrillator?no    MEDICATIONS & ALLERGIES:    Patient reports the following regarding taking any anticoagulation/antiplatelet therapy:   Plavix, Coumadin, Eliquis, Xarelto, Lovenox, Pradaxa, Brilinta, or Effient? no Aspirin? no  Patient confirms/reports the following medications:  Current Outpatient Prescriptions  Medication Sig Dispense Refill  . albuterol (PROVENTIL HFA;VENTOLIN HFA) 108 (90 Base) MCG/ACT inhaler Inhale into the lungs.    Marland Kitchen atorvastatin (LIPITOR) 20 MG tablet     . HYDROcodone-acetaminophen (NORCO/VICODIN) 5-325 MG tablet Take 1 tablet by mouth every 4 (four) hours as needed for moderate pain. (Patient not taking: Reported on 12/28/2016) 15 tablet 0  . lisinopril (PRINIVIL,ZESTRIL) 40 MG tablet     . meloxicam (MOBIC) 7.5 MG tablet Take 1 tablet (7.5 mg total) by mouth daily. (Patient not taking: Reported on 12/28/2016) 30 tablet 2  . metFORMIN (GLUCOPHAGE) 500 MG tablet      No current facility-administered medications for this visit.     Patient confirms/reports the following allergies:  No Known Allergies  No orders of the defined types were placed in this encounter.   AUTHORIZATION INFORMATION Primary Insurance: 1D#: Group #:  Secondary Insurance: 1D#: Group #:  SCHEDULE INFORMATION: Date: 01/19/17 Time: Location: [place]

## 2016-12-29 ENCOUNTER — Telehealth: Payer: Self-pay | Admitting: Gastroenterology

## 2016-12-29 NOTE — Telephone Encounter (Signed)
12/29/16 Prior Auth NOT required per automated system for Colonoscopy [phone] / [phone]

## 2017-01-17 IMAGING — CR DG SHOULDER 2+V*R*
3 series · 3 of 3 positions shown · non-contrast
Comparison: None

CLINICAL DATA: Chronic pain RIGHT shoulder for 20+ years, no
injury, worsened pain over past 4 days

EXAM:
RIGHT SHOULDER - 2+ VIEW

[shoulder grashey]
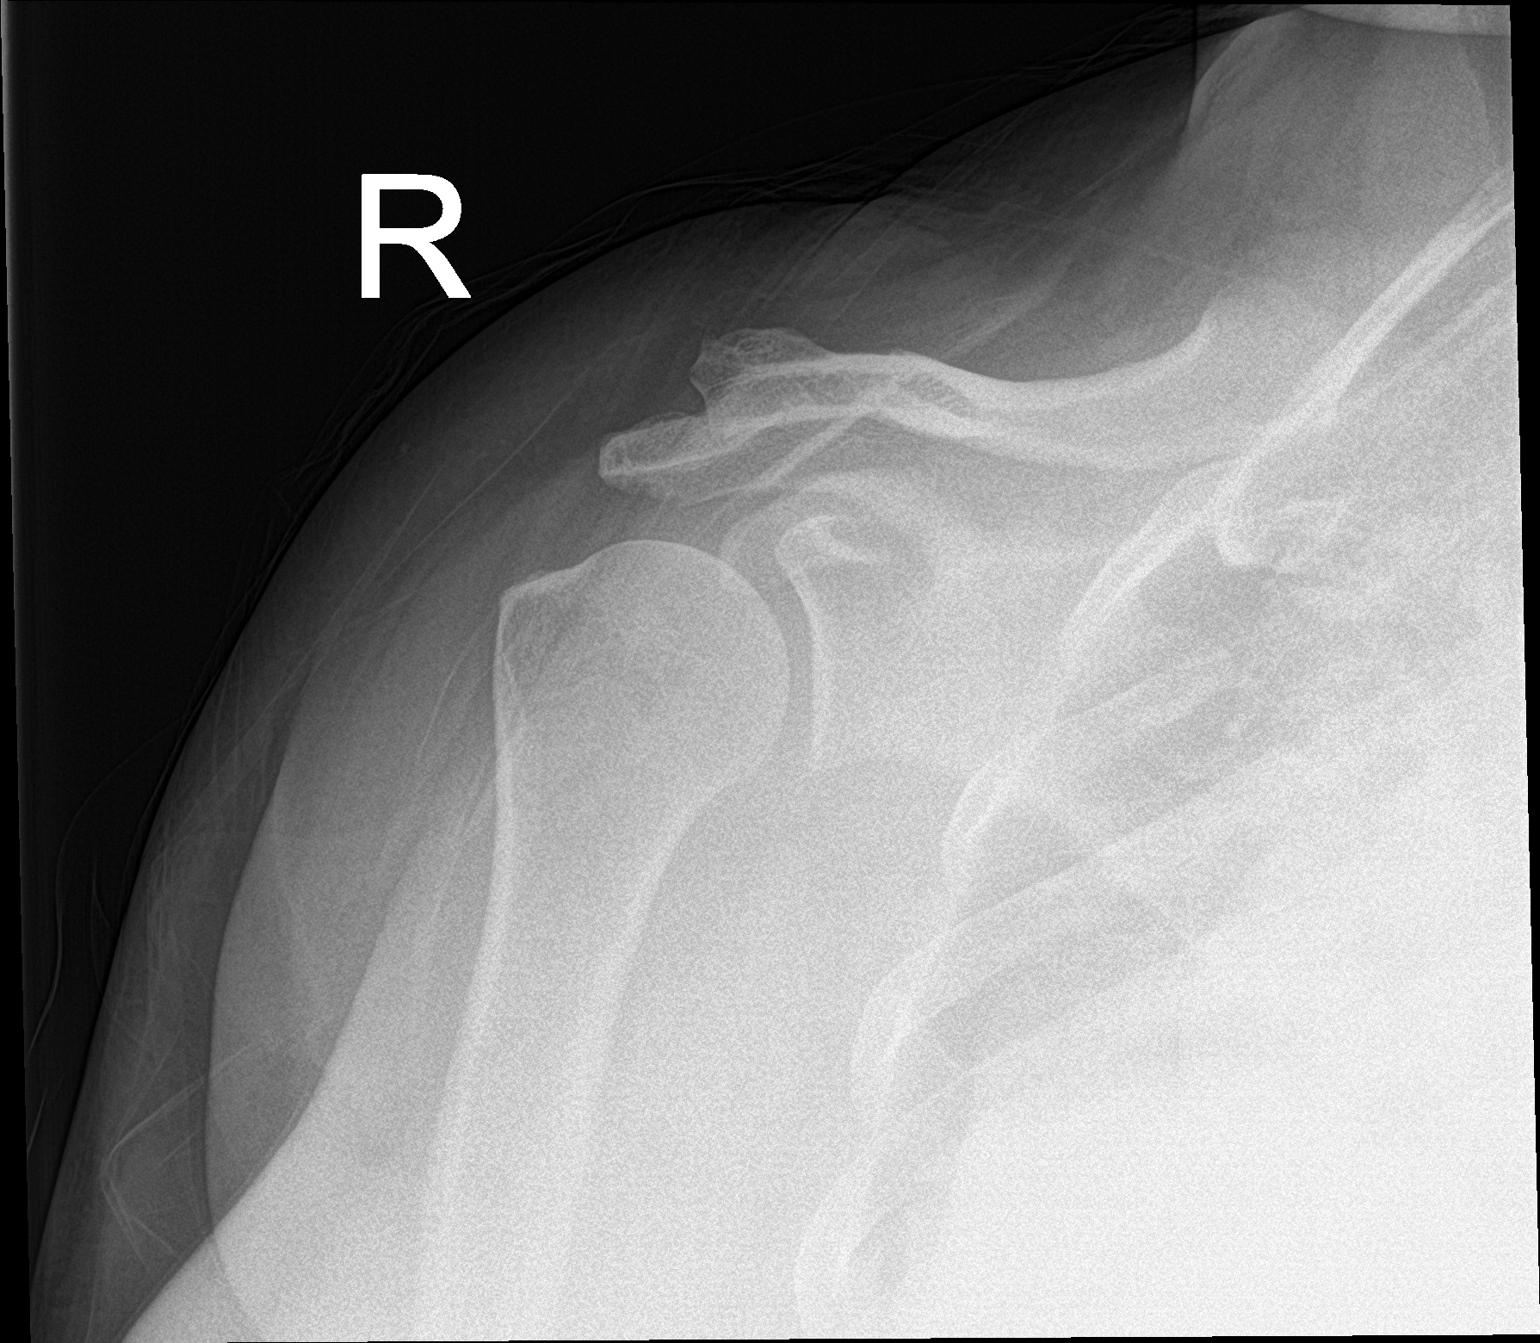

[shoulder y view]
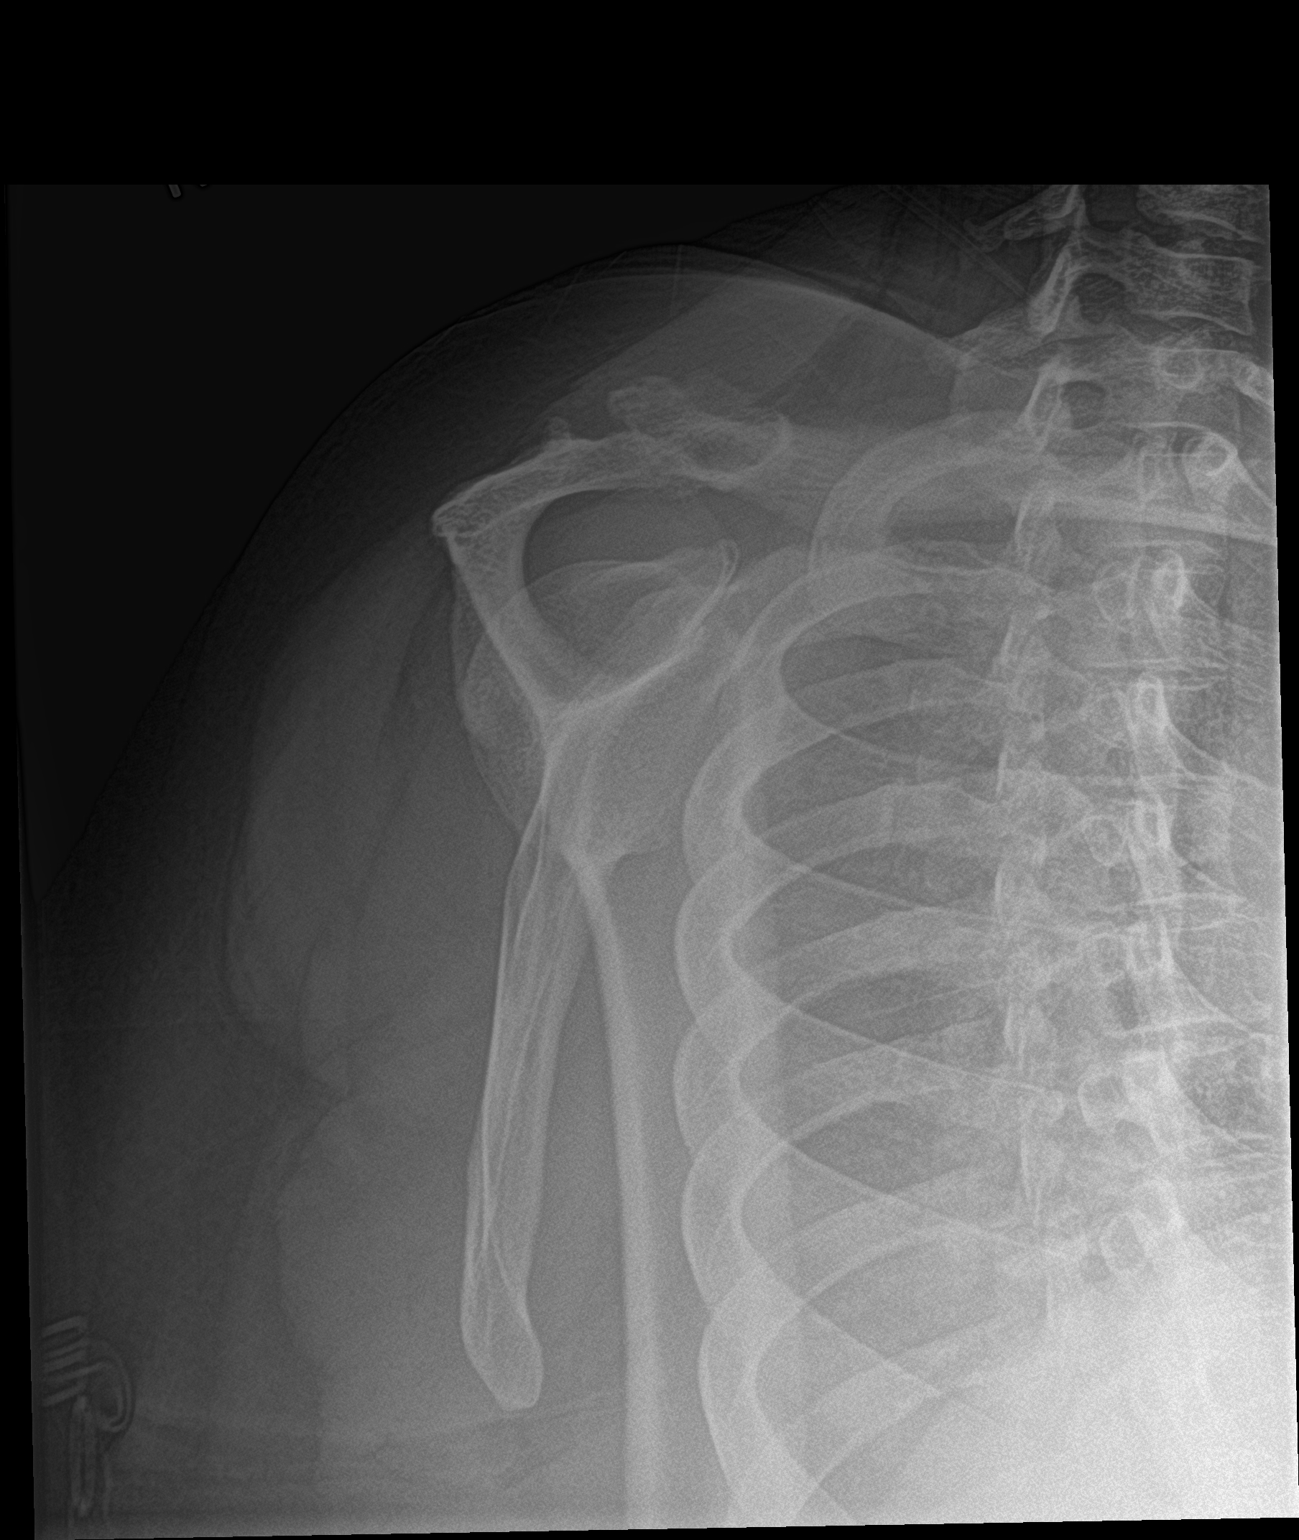

[shoulder axillary]
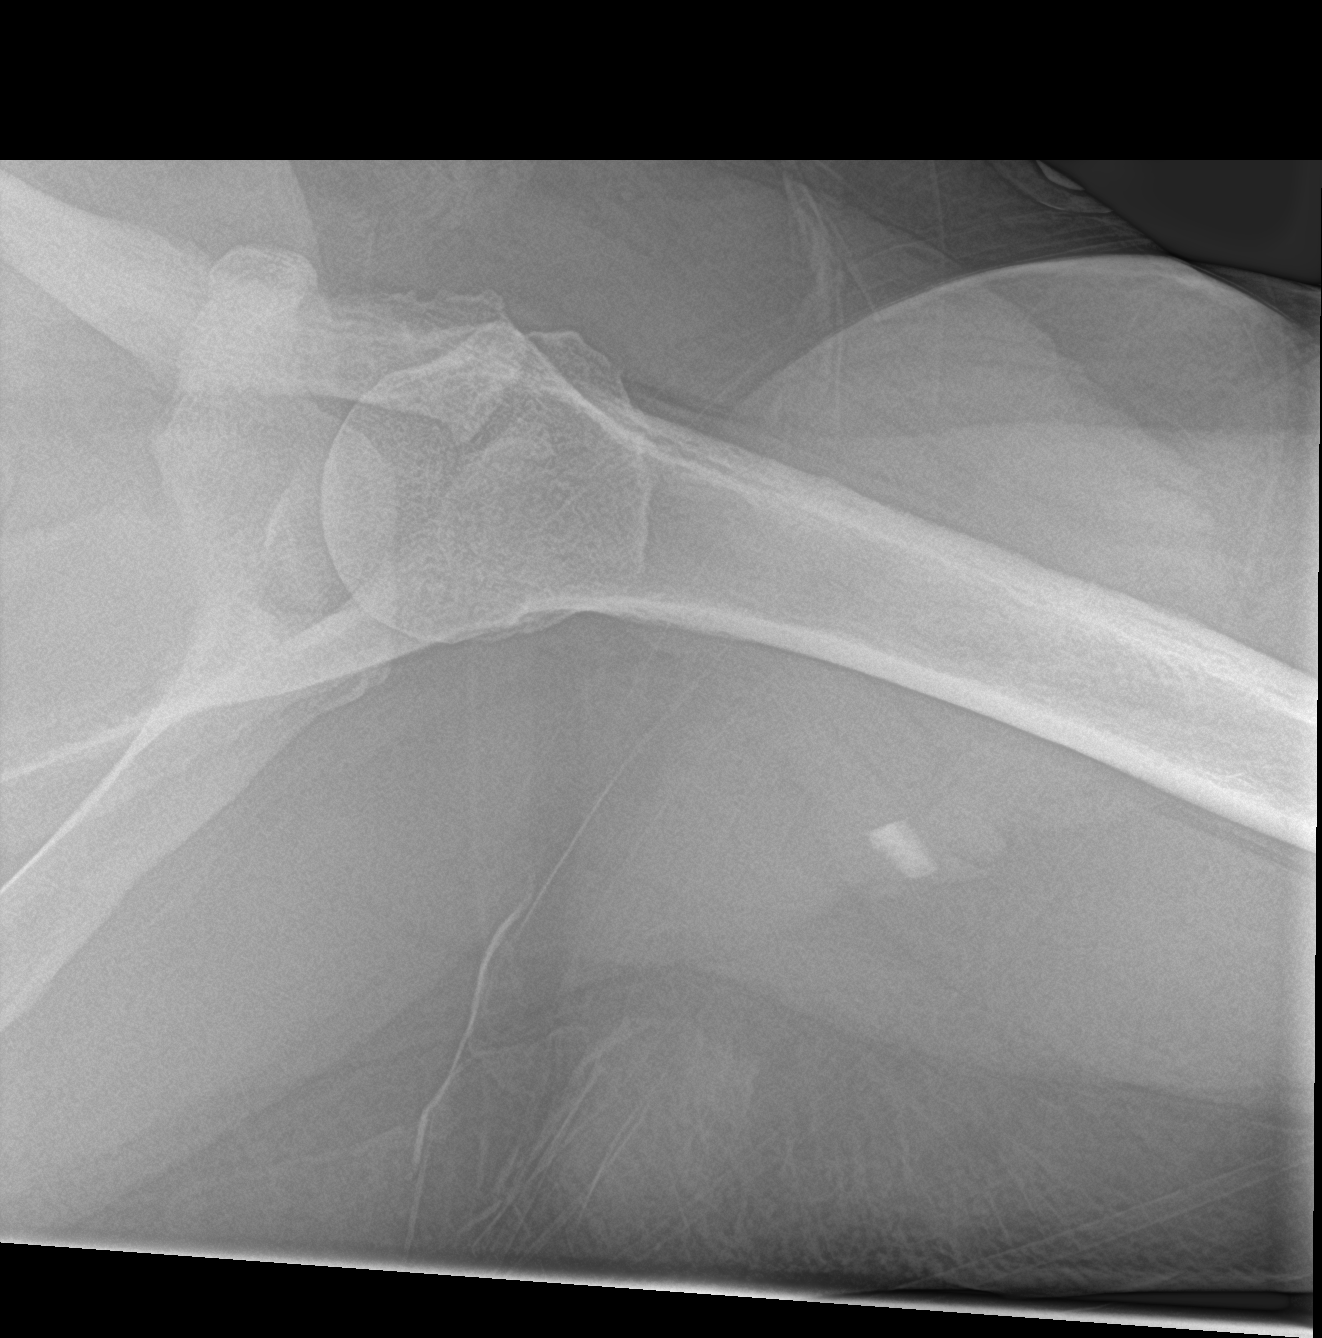

[3 of 3 positions shown; findings below may reference images not displayed]

FINDINGS: Osseous mineralization normal.

Mild AC joint degenerative changes.

No acute fracture, dislocation, or bone destruction.

Visualized RIGHT ribs intact.
IMPRESSION: No acute abnormalities.

## 2017-01-19 ENCOUNTER — Ambulatory Visit: Payer: Managed Care, Other (non HMO) | Admitting: Anesthesiology

## 2017-01-19 ENCOUNTER — Ambulatory Visit
Admission: RE | Admit: 2017-01-19 | Discharge: 2017-01-19 | Disposition: A | Discharge status: Home / Self Care | Payer: Managed Care, Other (non HMO) | Source: Ambulatory Visit | Attending: Gastroenterology | Admitting: Gastroenterology

## 2017-01-19 ENCOUNTER — Encounter
Admission: RE | Disposition: A | Discharge status: Home / Self Care | Payer: Self-pay | Source: Ambulatory Visit | Attending: Gastroenterology

## 2017-01-19 DIAGNOSIS — D122 Benign neoplasm of ascending colon: Secondary | ICD-10-CM | POA: Diagnosis not present

## 2017-01-19 DIAGNOSIS — D124 Benign neoplasm of descending colon: Secondary | ICD-10-CM | POA: Insufficient documentation

## 2017-01-19 DIAGNOSIS — Z79899 Other long term (current) drug therapy: Secondary | ICD-10-CM | POA: Insufficient documentation

## 2017-01-19 DIAGNOSIS — Z7984 Long term (current) use of oral hypoglycemic drugs: Secondary | ICD-10-CM | POA: Diagnosis not present

## 2017-01-19 DIAGNOSIS — K625 Hemorrhage of anus and rectum: Secondary | ICD-10-CM | POA: Diagnosis not present

## 2017-01-19 DIAGNOSIS — E7800 Pure hypercholesterolemia, unspecified: Secondary | ICD-10-CM | POA: Diagnosis not present

## 2017-01-19 DIAGNOSIS — D120 Benign neoplasm of cecum: Secondary | ICD-10-CM | POA: Diagnosis not present

## 2017-01-19 DIAGNOSIS — I10 Essential (primary) hypertension: Secondary | ICD-10-CM | POA: Diagnosis not present

## 2017-01-19 DIAGNOSIS — E119 Type 2 diabetes mellitus without complications: Secondary | ICD-10-CM | POA: Diagnosis not present

## 2017-01-19 DIAGNOSIS — K5730 Diverticulosis of large intestine without perforation or abscess without bleeding: Secondary | ICD-10-CM | POA: Insufficient documentation

## 2017-01-19 DIAGNOSIS — D125 Benign neoplasm of sigmoid colon: Secondary | ICD-10-CM

## 2017-01-19 DIAGNOSIS — F17210 Nicotine dependence, cigarettes, uncomplicated: Secondary | ICD-10-CM | POA: Diagnosis not present

## 2017-01-19 DIAGNOSIS — Z86010 Personal history of colonic polyps: Secondary | ICD-10-CM

## 2017-01-19 HISTORY — PX: COLONOSCOPY WITH PROPOFOL: SHX5780

## 2017-01-19 LAB — GLUCOSE, CAPILLARY: GLUCOSE-CAPILLARY: 135 mg/dL — AB (ref 65–99)

## 2017-01-19 LAB — POCT PREGNANCY, URINE: Preg Test, Ur: NEGATIVE

## 2017-01-19 SURGERY — COLONOSCOPY WITH PROPOFOL
Anesthesia: General

## 2017-01-19 MED ORDER — PROPOFOL 10 MG/ML IV BOLUS
INTRAVENOUS | Status: DC | PRN
  Administered 2017-01-19: 50 mg via INTRAVENOUS

## 2017-01-19 MED ORDER — PROPOFOL 10 MG/ML IV BOLUS
INTRAVENOUS | Status: AC
  Filled 2017-01-19: qty 20

## 2017-01-19 MED ORDER — MIDAZOLAM HCL 2 MG/2ML IJ SOLN
INTRAMUSCULAR | Status: AC
  Filled 2017-01-19: qty 2

## 2017-01-19 MED ORDER — PROPOFOL 10 MG/ML IV BOLUS
INTRAVENOUS | Status: AC
  Filled 2017-01-19: qty 40

## 2017-01-19 MED ORDER — GLUCAGON HCL (RDNA) 1 MG IJ SOLR
INTRAMUSCULAR | Status: DC | PRN
  Administered 2017-01-19: .5 mg via INTRAVENOUS

## 2017-01-19 MED ORDER — SODIUM CHLORIDE 0.9 % IV SOLN
INTRAVENOUS | Status: DC
  Administered 2017-01-19: 09:00:00 via INTRAVENOUS

## 2017-01-19 MED ORDER — MIDAZOLAM HCL 2 MG/2ML IJ SOLN
INTRAMUSCULAR | Status: DC | PRN
  Administered 2017-01-19: 2 mg via INTRAVENOUS

## 2017-01-19 MED ORDER — LIDOCAINE HCL (CARDIAC) 20 MG/ML IV SOLN
INTRAVENOUS | Status: DC | PRN
  Administered 2017-01-19: 50 mg via INTRAVENOUS

## 2017-01-19 MED ORDER — LIDOCAINE HCL (PF) 2 % IJ SOLN
INTRAMUSCULAR | Status: AC
  Filled 2017-01-19: qty 2

## 2017-01-19 MED ORDER — SPOT INK MARKER SYRINGE KIT
PACK | SUBMUCOSAL | Status: DC | PRN
  Administered 2017-01-19: 10 mL via SUBMUCOSAL

## 2017-01-19 MED ORDER — GLUCAGON HCL RDNA (DIAGNOSTIC) 1 MG IJ SOLR
INTRAMUSCULAR | Status: AC
  Filled 2017-01-19: qty 1

## 2017-01-19 MED ORDER — PHENYLEPHRINE HCL 10 MG/ML IJ SOLN
INTRAMUSCULAR | Status: DC | PRN
  Administered 2017-01-19: 100 ug via INTRAVENOUS

## 2017-01-19 MED ORDER — PROPOFOL 500 MG/50ML IV EMUL
INTRAVENOUS | Status: DC | PRN
  Administered 2017-01-19: 150 ug/kg/min via INTRAVENOUS

## 2017-01-19 NOTE — Anesthesia Postprocedure Evaluation (Signed)
Anesthesia Post Note  Patient: Barbara Pierce  Procedure(s) Performed: Procedure(s) (LRB): COLONOSCOPY WITH PROPOFOL (N/A)  Patient location during evaluation: PACU Anesthesia Type: General Level of consciousness: awake and alert and oriented Pain management: pain level controlled Vital Signs Assessment: post-procedure vital signs reviewed and stable Respiratory status: spontaneous breathing Cardiovascular status: blood pressure returned to baseline Anesthetic complications: no     Last Vitals:  Vitals:   01/19/17 0959 01/19/17 1009  BP: 128/79 138/90  Pulse: 69   Resp: 10   Temp:      Last Pain:  Vitals:   01/19/17 0939  TempSrc: Tympanic                 Bharath Bernstein

## 2017-01-19 NOTE — Anesthesia Procedure Notes (Signed)
Performed by: Doreen Salvage Pre-anesthesia Checklist: Patient identified, Emergency Drugs available, Suction available and Patient being monitored Patient Re-evaluated:Patient Re-evaluated prior to inductionOxygen Delivery Method: Nasal cannula Intubation Type: IV induction Dental Injury: Teeth and Oropharynx as per pre-operative assessment  Comments: Nasal cannula with etCO2 monitoring

## 2017-01-19 NOTE — Anesthesia Preprocedure Evaluation (Addendum)
Anesthesia Evaluation  Patient identified by MRN, date of birth, ID band Patient awake    Reviewed: Allergy & Precautions, NPO status , Patient's Chart, lab work & pertinent test results  Airway Mallampati: III  TM Distance: <3 FB     Dental  (+) Caps   Pulmonary Current Smoker,    Pulmonary exam normal        Cardiovascular hypertension, Pt. on medications Normal cardiovascular exam     Neuro/Psych negative neurological ROS  negative psych ROS   GI/Hepatic negative GI ROS, Neg liver ROS,   Endo/Other  diabetes, Well Controlled, Type 2, Oral Hypoglycemic Agents  Renal/GU negative Renal ROS  negative genitourinary   Musculoskeletal negative musculoskeletal ROS (+)   Abdominal Normal abdominal exam  (+)   Peds negative pediatric ROS (+)  Hematology negative hematology ROS (+)   Anesthesia Other Findings   Reproductive/Obstetrics                            Anesthesia Physical Anesthesia Plan  ASA: III  Anesthesia Plan: General   Post-op Pain Management:    Induction: Intravenous  Airway Management Planned: Nasal Cannula  Additional Equipment:   Intra-op Plan:   Post-operative Plan:   Informed Consent: I have reviewed the patients History and Physical, chart, labs and discussed the procedure including the risks, benefits and alternatives for the proposed anesthesia with the patient or authorized representative who has indicated his/her understanding and acceptance.   Dental advisory given  Plan Discussed with: CRNA and Surgeon  Anesthesia Plan Comments:         Anesthesia Quick Evaluation

## 2017-01-19 NOTE — Op Note (Signed)
[facility] Gastroenterology Patient Name: Barbara Pierce Procedure Date: 01/19/2017 8:53 AM MRN: [id number] Account #: [id number] Date of Birth: [birth year] Admit Type: Outpatient Age: 46 Room: [facility] ENDO ROOM 1 Gender: Female Note Status: Finalized Procedure:            Colonoscopy Indications:          Rectal bleeding Providers:            [name] MD, MD Referring MD:         Cletis Athens, MD (Referring MD) Medicines:            Monitored Anesthesia Care Complications:        No immediate complications. Procedure:            Pre-Anesthesia Assessment:                       - Prior to the procedure, a History and Physical was                        performed, and patient medications, allergies and                        sensitivities were reviewed. The patient's tolerance of                        previous anesthesia was reviewed.                       - The risks and benefits of the procedure and the                        sedation options and risks were discussed with the                        patient. All questions were answered and informed                        consent was obtained.                       - ASA Grade Assessment: III - A patient with severe                        systemic disease.                       After obtaining informed consent, the colonoscope was                        passed under direct vision. Throughout the procedure,                        the patient's blood pressure, pulse, and oxygen                        saturations were monitored continuously. The                        Colonoscope was introduced through the anus and                        advanced to  the the cecum, identified by the                        appendiceal orifice, IC valve and transillumination.                        The colonoscopy was performed with ease. The patient                        tolerated the procedure well. The quality of the bowel                         preparation was good. Findings:      The perianal and digital rectal examinations were normal.      A few small-mouthed diverticula were found in the sigmoid colon.      A 10 mm polyp was found in the cecum. The polyp was sessile. The polyp       was removed with a hot snare. Resection and retrieval were complete.      Two sessile polyps were found in the ascending colon. The polyps were 6       to 8 mm in size. These polyps were removed with a cold snare. Resection       and retrieval were complete.      A 10 mm polyp was found in the ascending colon. The polyp was sessile.       The polyp was removed with a hot snare. Resection and retrieval were       complete.      A 20 mm polyp was found in the sigmoid colon. The polyp was       pedunculated. The polyp was removed with a hot snare. Resection and       retrieval were complete. Area was tattooed with an injection of [place]       ink. To prevent bleeding after the polypectomy, two hemostatic clips       were successfully placed. There was no bleeding during the maneuver.      At the 25 cm mark there was a wide thickened fold which appeared more       like a submucosal lesion with relatively normal overlying mucosa under       white and blue light. No biopsies taken, Area was tattooed with an       injection of [place] ink.      The exam was otherwise without abnormality on direct and retroflexion       views. Impression:           - Diverticulosis in the sigmoid colon.                       - One 10 mm polyp in the cecum, removed with a hot                        snare. Resected and retrieved.                       - Two 6 to 8 mm polyps in the ascending colon, removed                        with a cold snare. Resected and retrieved.                       -  One 10 mm polyp in the ascending colon, removed with                        a hot snare. Resected and retrieved.                       - One 20 mm polyp in the sigmoid  colon, removed with a                        hot snare. Resected and retrieved. Tattooed. Clips were                        placed.                       - Mucosa in the sigmoid colon. Tattooed.                       - The examination was otherwise normal on direct and                        retroflexion views. Recommendation:       - Discharge patient to home (with escort).                       - Resume previous diet.                       - No ibuprofen, naproxen, or other non-steroidal                        anti-inflammatory drugs for 6 weeks after polyp removal. Procedure Code(s):    --- Professional ---                       [phone], Colonoscopy, flexible; with removal of tumor(s),                        polyp(s), or other lesion(s) by snare technique                       [id number], Colonoscopy, flexible; with directed submucosal                        injection(s), any substance Diagnosis Code(s):    --- Professional ---                       D12.0, Benign neoplasm of cecum                       D12.2, Benign neoplasm of ascending colon                       D12.5, Benign neoplasm of sigmoid colon                       K63.89, Other specified diseases of intestine                       K62.5, Hemorrhage of anus and rectum  K57.30, Diverticulosis of large intestine without                        perforation or abscess without bleeding CPT copyright 2016 American Medical Association. All rights reserved. The codes documented in this report are preliminary and upon coder review may  be revised to meet current compliance requirements. [name], MD [name] MD, MD 01/19/2017 9:40:22 AM This report has been signed electronically. Number of Addenda: 0 Note Initiated On: 01/19/2017 8:53 AM Scope Withdrawal Time: 0 hours 29 minutes 19 seconds  Total Procedure Duration: 0 hours 31 minutes 16 seconds       [facility]

## 2017-01-19 NOTE — Transfer of Care (Signed)
Immediate Anesthesia Transfer of Care Note  Patient: Barbara Pierce  Procedure(s) Performed: Procedure(s): COLONOSCOPY WITH PROPOFOL (N/A)  Patient Location: PACU and Endoscopy Unit  Anesthesia Type:General  Level of Consciousness: sedated  Airway & Oxygen Therapy: Patient Spontanous Breathing and Patient connected to nasal cannula oxygen  Post-op Assessment: Report given to RN and Post -op Vital signs reviewed and stable  Post vital signs: Reviewed and stable  Last Vitals:  Vitals:   01/19/17 0803 01/19/17 0939  BP: (!) 136/98 117/72  Pulse: 98 91  Resp: 20 18  Temp: 36.6 C (!) 32.4 C    Complications: No apparent anesthesia complications

## 2017-01-19 NOTE — Anesthesia Post-op Follow-up Note (Cosign Needed)
Anesthesia QCDR form completed.        

## 2017-01-19 NOTE — H&P (Signed)
  [name] MD [address], [place], [place] [address] Phone: [phone] Fax : [phone]  Primary Care Physician:  Cletis Athens, MD Primary Gastroenterologist:  Dr. [name]   Pre-Procedure History & Physical: HPI:  Barbara Pierce is a 46 y.o. female is here for an colonoscopy.   Past Medical History:  Diagnosis Date  . Diabetes mellitus without complication ([place])   . High cholesterol   . Hypertension     Past Surgical History:  Procedure Laterality Date  . surgery right forearm      Prior to Admission medications   Medication Sig Start Date End Date Taking? Authorizing Provider  atorvastatin (LIPITOR) 20 MG tablet  12/07/16  Yes Historical Provider, MD  lisinopril (PRINIVIL,ZESTRIL) 40 MG tablet  12/07/16  Yes Historical Provider, MD  albuterol (PROVENTIL HFA;VENTOLIN HFA) 108 (90 Base) MCG/ACT inhaler Inhale into the lungs. 10/01/16   Historical Provider, MD  HYDROcodone-acetaminophen (NORCO/VICODIN) 5-325 MG tablet Take 1 tablet by mouth every 4 (four) hours as needed for moderate pain. Patient not taking: Reported on 12/28/2016 08/04/16   Johnn Hai, PA-C  meloxicam (MOBIC) 7.5 MG tablet Take 1 tablet (7.5 mg total) by mouth daily. Patient not taking: Reported on 12/28/2016 08/04/16 08/04/17  Johnn Hai, PA-C  metFORMIN (GLUCOPHAGE) 500 MG tablet  12/07/16   Historical Provider, MD    Allergies as of 12/28/2016  . (No Known Allergies)    History reviewed. No pertinent family history.  Social History   Social History  . Marital status: Married    Spouse name: N/A  . Number of children: N/A  . Years of education: N/A   Occupational History  . Not on file.   Social History Main Topics  . Smoking status: Current Every Day Smoker    Packs/day: 0.50    Types: Cigarettes  . Smokeless tobacco: Never Used  . Alcohol use Yes     Comment: occassional 2 weeks aago  . Drug use: No  . Sexual activity: Not on file   Other Topics Concern  . Not  on file   Social History Narrative  . No narrative on file    Review of Systems: See HPI, otherwise negative ROS  Physical Exam: BP (!) 122/93   Pulse 91   Temp (!) 96.7 F (35.9 C) (Tympanic)   Resp 18   Ht 4' 9.5" (1.461 m)   Wt 212 lb (96.2 kg)   SpO2 100%   BMI 45.08 kg/m  General:   Alert,  pleasant and cooperative in NAD Head:  Normocephalic and atraumatic. Neck:  Supple; no masses or thyromegaly. Lungs:  Clear throughout to auscultation.    Heart:  Regular rate and rhythm. Abdomen:  Soft, nontender and nondistended. Normal bowel sounds, without guarding, and without rebound.   Neurologic:  Alert and  oriented x4;  grossly normal neurologically.  Impression/Plan: Barbara Pierce is here for an colonoscopy to be performed for rectal bleeding   Risks, benefits, limitations, and alternatives regarding  colonoscopy have been reviewed with the patient.  Questions have been answered.  All parties agreeable.   [name], MD  01/19/2017, 9:58 AM

## 2017-01-20 ENCOUNTER — Encounter: Payer: Self-pay | Admitting: Gastroenterology

## 2017-01-20 LAB — SURGICAL PATHOLOGY

## 2017-01-25 ENCOUNTER — Other Ambulatory Visit: Payer: Self-pay

## 2017-01-25 ENCOUNTER — Telehealth: Payer: Self-pay

## 2017-01-25 DIAGNOSIS — Z86010 Personal history of colonic polyps: Secondary | ICD-10-CM

## 2017-01-25 NOTE — Telephone Encounter (Signed)
Advised pt of results per Dr. Vicente Males.   Scheduled Flex Sig

## 2017-02-03 ENCOUNTER — Encounter: Payer: Self-pay | Admitting: Gastroenterology

## 2017-02-03 ENCOUNTER — Ambulatory Visit (INDEPENDENT_AMBULATORY_CARE_PROVIDER_SITE_OTHER): Payer: Managed Care, Other (non HMO) | Admitting: Gastroenterology

## 2017-02-03 VITALS — BP 125/73 | HR 83 | Temp 97.9°F | Ht <= 58 in | Wt 224.4 lb

## 2017-02-03 DIAGNOSIS — Z86010 Personal history of colonic polyps: Secondary | ICD-10-CM

## 2017-02-03 NOTE — Progress Notes (Signed)
Primary Care Physician: Cletis Athens, MD  Primary Gastroenterologist:  Dr. [name]   Chief Complaint  Patient presents with  . Follow-up    DISCUSS COLONOSCOPY RESULTS    HPI: Barbara Pierce is a 46 y.o. female is here for a follow up visit.  Summary of history :  She was initially seen on 12/28/16 for blood on her stools. Family history of colon polyps. I performed a colonoscopy on 01/19/17 . She had multiple polyps of the colon ranging from 10 mm to 20 mm which were resected and pathology returned as a tubular adenoma with no dysplasia. At the 25 cm mark she had an abnormal appearing segment which appeared to be a submucosal lesion- I tatooed the area but did not take any biopsies to avoid scar tissue.   Interval history   01/19/2017-  5/16 /2018   Doing well no rectal bleeding .    Current Outpatient Prescriptions  Medication Sig Dispense Refill  . atorvastatin (LIPITOR) 20 MG tablet     . lisinopril (PRINIVIL,ZESTRIL) 20 MG tablet Take 40 mg by mouth daily.  6  . metFORMIN (GLUCOPHAGE) 500 MG tablet     . albuterol (PROVENTIL HFA;VENTOLIN HFA) 108 (90 Base) MCG/ACT inhaler Inhale into the lungs.    Marland Kitchen HYDROcodone-acetaminophen (NORCO/VICODIN) 5-325 MG tablet Take 1 tablet by mouth every 4 (four) hours as needed for moderate pain. (Patient not taking: Reported on 12/28/2016) 15 tablet 0  . lisinopril (PRINIVIL,ZESTRIL) 40 MG tablet      No current facility-administered medications for this visit.     Allergies as of 02/03/2017  . (Not on File)    ROS:  General: Negative for anorexia, weight loss, fever, chills, fatigue, weakness. ENT: Negative for hoarseness, difficulty swallowing , nasal congestion. CV: Negative for chest pain, angina, palpitations, dyspnea on exertion, peripheral edema.  Respiratory: Negative for dyspnea at rest, dyspnea on exertion, cough, sputum, wheezing.  GI: See history of present illness. GU:  Negative for dysuria, hematuria, urinary incontinence,  urinary frequency, nocturnal urination.  Endo: Negative for unusual weight change.    Physical Examination:   BP 125/73   Pulse 83   Temp 97.9 F (36.6 C) (Oral)   Ht 4\' 9"  (1.448 m)   Wt 224 lb 6.4 oz (101.8 kg)   BMI 48.56 kg/m   General: Well-nourished, well-developed in no acute distress.  Eyes: No icterus. Conjunctivae pink. Mouth: Oropharyngeal mucosa moist and pink , no lesions erythema or exudate. Lungs: Clear to auscultation bilaterally. Non-labored. Heart: Regular rate and rhythm, no murmurs rubs or gallops.  Abdomen: Bowel sounds are normal, nontender, nondistended, no hepatosplenomegaly or masses, no abdominal bruits or hernia , no rebound or guarding.   Extremities: No lower extremity edema. No clubbing or deformities. Neuro: Alert and oriented x 3.  Grossly intact. Skin: Warm and dry, no jaundice.   Psych: Alert and cooperative, normal mood and affect.  Imaging Studies: No results found.  Assessment and Plan:   Barbara Pierce is a 46 y.o. y/o female here to follow up to her recent colonoscopy where multiple large polyps were excised and was also noted to have a small area of abnormal appearing mucosa which was not biopsied intentionally to prevent scar tissue which would make further resection harder. I explained her endoscopy findings and my future plan.    1. Flexible sigmoidoscopy on September 6th to evaluate prior polypectomy site of the large pedunculated polyp to ensure no residual polyp as well as evaluate the abnornal  area that was tatooed- if needs further evaluation at that time will need to be sent to [facility] or [facility] for EUS    Dr [name]  MD Follow up in 3 months

## 2017-03-31 ENCOUNTER — Ambulatory Visit: Payer: Managed Care, Other (non HMO) | Admitting: Dietician

## 2017-04-19 ENCOUNTER — Ambulatory Visit: Payer: Managed Care, Other (non HMO) | Admitting: Dietician

## 2017-04-20 ENCOUNTER — Encounter: Payer: Self-pay | Admitting: Dietician

## 2017-05-26 ENCOUNTER — Telehealth: Payer: Self-pay | Admitting: Gastroenterology

## 2017-05-26 NOTE — Telephone Encounter (Signed)
Patient forgot about procedure and needs to reschedule for tomorrow.

## 2017-05-26 NOTE — Telephone Encounter (Signed)
Needs to reschedule procedure due to work. Please call [phone]

## 2017-05-27 ENCOUNTER — Ambulatory Visit
Admission: RE | Admit: 2017-05-27 | Payer: Managed Care, Other (non HMO) | Source: Ambulatory Visit | Admitting: Gastroenterology

## 2017-05-27 ENCOUNTER — Encounter: Admission: RE | Payer: Self-pay | Source: Ambulatory Visit

## 2017-05-27 SURGERY — SIGMOIDOSCOPY, FLEXIBLE
Anesthesia: General

## 2017-06-01 ENCOUNTER — Telehealth: Payer: Self-pay | Admitting: Gastroenterology

## 2017-06-01 NOTE — Telephone Encounter (Signed)
Patient LVM to have Dr. Georgeann Oppenheim nurse call. She did not leave a reason why.

## 2017-06-02 ENCOUNTER — Emergency Department
Admission: EM | Admit: 2017-06-02 | Discharge: 2017-06-02 | Disposition: A | Discharge status: Home / Self Care | Payer: Managed Care, Other (non HMO) | Attending: Emergency Medicine | Admitting: Emergency Medicine

## 2017-06-02 ENCOUNTER — Emergency Department: Payer: Managed Care, Other (non HMO)

## 2017-06-02 ENCOUNTER — Encounter: Payer: Self-pay | Admitting: Emergency Medicine

## 2017-06-02 DIAGNOSIS — F17210 Nicotine dependence, cigarettes, uncomplicated: Secondary | ICD-10-CM | POA: Diagnosis not present

## 2017-06-02 DIAGNOSIS — G4730 Sleep apnea, unspecified: Secondary | ICD-10-CM | POA: Diagnosis not present

## 2017-06-02 DIAGNOSIS — E119 Type 2 diabetes mellitus without complications: Secondary | ICD-10-CM | POA: Insufficient documentation

## 2017-06-02 DIAGNOSIS — I10 Essential (primary) hypertension: Secondary | ICD-10-CM | POA: Insufficient documentation

## 2017-06-02 DIAGNOSIS — Z720 Tobacco use: Secondary | ICD-10-CM

## 2017-06-02 DIAGNOSIS — D509 Iron deficiency anemia, unspecified: Secondary | ICD-10-CM | POA: Insufficient documentation

## 2017-06-02 DIAGNOSIS — Z7984 Long term (current) use of oral hypoglycemic drugs: Secondary | ICD-10-CM | POA: Insufficient documentation

## 2017-06-02 DIAGNOSIS — R0602 Shortness of breath: Secondary | ICD-10-CM | POA: Diagnosis present

## 2017-06-02 DIAGNOSIS — Z79899 Other long term (current) drug therapy: Secondary | ICD-10-CM | POA: Diagnosis not present

## 2017-06-02 LAB — BASIC METABOLIC PANEL
ANION GAP: 9 (ref 5–15)
BUN: 14 mg/dL (ref 6–20)
CHLORIDE: 106 mmol/L (ref 101–111)
CO2: 22 mmol/L (ref 22–32)
Calcium: 8.5 mg/dL — ABNORMAL LOW (ref 8.9–10.3)
Creatinine, Ser: 0.69 mg/dL (ref 0.44–1.00)
GFR calc Af Amer: >60 mL/min (ref >60)
GLUCOSE: 175 mg/dL — AB (ref 65–99)
POTASSIUM: 3.9 mmol/L (ref 3.5–5.1)
Sodium: 137 mmol/L (ref 135–145)

## 2017-06-02 LAB — CBC
HEMATOCRIT: 26.4 % — AB (ref 35.0–47.0)
HEMOGLOBIN: 8.6 g/dL — AB (ref 12.0–16.0)
MCH: 27.6 pg (ref 26.0–34.0)
MCHC: 32.7 g/dL (ref 32.0–36.0)
MCV: 84.3 fL (ref 80.0–100.0)
Platelets: 305 10*3/uL (ref 150–440)
RBC: 3.13 MIL/uL — ABNORMAL LOW (ref 3.80–5.20)
RDW: 16 % — ABNORMAL HIGH (ref 11.5–14.5)
WBC: 13.7 10*3/uL — AB (ref 3.6–11.0)

## 2017-06-02 LAB — TROPONIN I: Troponin I: <0.03 ng/mL (ref <0.03)

## 2017-06-02 MED ORDER — FERROUS SULFATE 325 (65 FE) MG PO TBEC: 325.0000 mg | DELAYED_RELEASE_TABLET | Freq: Three times a day (TID) | ORAL | 0 refills | Status: DC

## 2017-06-02 MED ORDER — MULTI-VITS/FLUORIDE 0.25 MG PO CHEW: 1.0000 | CHEWABLE_TABLET | Freq: Every day | ORAL | 0 refills | Status: AC

## 2017-06-02 NOTE — ED Provider Notes (Signed)
[facility] Emergency Department Provider Note  ____________________________________________   First MD Initiated Contact with Patient 06/02/17 [phone]     (approximate)  I have reviewed the triage vital signs and the nursing notes.   HISTORY  Chief Complaint Cough and Shortness of Breath    HPI Barbara Pierce is a 46 y.o. female who self presents the emergency department after having difficulty breathing while falling asleep today. She has a recent diagnosis of sleep apnea but has yet to receive her CPAP machine. She said that tonight every time she tried to fall asleep she felt anxious and difficulty breathing. She recently stopped smoking cigarettes 2 days ago and it is made her nervous. She has no particular chest pain at this time, although previously she had sharp brief fleeting non-exertional nonradiating diffuse upper chest pain.   Past Medical History:  Diagnosis Date  . Diabetes mellitus without complication ([place])   . High cholesterol   . Hypertension     Patient Active Problem List   Diagnosis Date Noted  . Impingement syndrome of shoulder region 08/05/2016    Past Surgical History:  Procedure Laterality Date  . COLONOSCOPY WITH PROPOFOL N/A 01/19/2017   Procedure: COLONOSCOPY WITH PROPOFOL;  Surgeon: [name], MD;  Location: [facility] ENDOSCOPY;  Service: Endoscopy;  Laterality: N/A;  . surgery right forearm      Prior to Admission medications   Medication Sig Start Date End Date Taking? Authorizing Provider  albuterol (PROVENTIL HFA;VENTOLIN HFA) 108 (90 Base) MCG/ACT inhaler Inhale into the lungs. 10/01/16   [provider]  atorvastatin (LIPITOR) 20 MG tablet  12/07/16   [provider]  ferrous sulfate 325 (65 FE) MG EC tablet Take 1 tablet (325 mg total) by mouth 3 (three) times daily. 06/02/17 06/02/18  Darel Hong, MD  HYDROcodone-acetaminophen (NORCO/VICODIN) 5-325 MG tablet Take 1 tablet by mouth every 4 (four)  hours as needed for moderate pain. Patient not taking: Reported on 12/28/2016 08/04/16   Johnn Hai, PA-C  lisinopril (PRINIVIL,ZESTRIL) 20 MG tablet Take 40 mg by mouth daily. 01/28/17   [provider]  lisinopril (PRINIVIL,ZESTRIL) 40 MG tablet  12/07/16   [provider]  metFORMIN (GLUCOPHAGE) 500 MG tablet  12/07/16   [provider]  pediatric multivitamin-fluoride (POLY-VI-FLOR) 0.25 MG chewable tablet Chew 1 tablet by mouth daily. 06/02/17 06/02/18  Darel Hong, MD    Allergies Patient has no known allergies.  History reviewed. No pertinent family history.  Social History Social History  Substance Use Topics  . Smoking status: Current Every Day Smoker    Packs/day: 0.50    Types: Cigarettes  . Smokeless tobacco: Never Used  . Alcohol use Yes     Comment: occassional 2 weeks aago    Review of Systems Constitutional: No fever/chills Eyes: No visual changes. ENT: No sore throat. Cardiovascular: positive chest pain. Respiratory: positive shortness of breath. Gastrointestinal: No abdominal pain.  No nausea, no vomiting.  No diarrhea.  No constipation. Genitourinary: Negative for dysuria. Musculoskeletal: Negative for back pain. Skin: Negative for rash. Neurological: Negative for headaches, focal weakness or numbness.   ____________________________________________   PHYSICAL EXAM:  VITAL SIGNS: ED Triage Vitals  Enc Vitals Group     BP 06/02/17 0346 (!) 142/81     Pulse Rate 06/02/17 0346 85     Resp 06/02/17 0346 17     Temp 06/02/17 0346 98.3 F (36.8 C)     Temp Source 06/02/17 0346 Oral  SpO2 06/02/17 0346 99 %     Weight 06/02/17 0342 224 lb (101.6 kg)     Height --      Head Circumference --      Peak Flow --      Pain Score --      Pain Loc --      Pain Edu? --      Excl. in [place]? --     Constitutional: alert and oriented 4 pleasant cooperative speaks in full clear sentences no diaphoresis Eyes: PERRL  EOMI. Head: Atraumatic. Nose: No congestion/rhinnorhea. Mouth/Throat: No trismus Neck: No stridor.   Cardiovascular: Normal rate, regular rhythm. Grossly normal heart sounds.  Good peripheral circulation. Respiratory: Normal respiratory effort.  No retractions. Lungs CTAB and moving good air Gastrointestinal: obese soft nontender Musculoskeletal: No lower extremity edema   Neurologic:  Normal speech and language. No gross focal neurologic deficits are appreciated. Skin:  Skin is warm, dry and intact. No rash noted. Psychiatric: Mood and affect are normal. Speech and behavior are normal.    _________________________________   LABS (all labs ordered are listed, but only abnormal results are displayed)  Labs Reviewed  BASIC METABOLIC PANEL - Abnormal; Notable for the following:       Result Value   Glucose, Bld 175 (*)    Calcium 8.5 (*)    All other components within normal limits  CBC - Abnormal; Notable for the following:    WBC 13.7 (*)    RBC 3.13 (*)    Hemoglobin 8.6 (*)    HCT 26.4 (*)    RDW 16.0 (*)    All other components within normal limits  TROPONIN I    hemoglobin is low but stable __________________________________________  EKG  ED ECG REPORT I, Darel Hong, the attending physician, personally viewed and interpreted this ECG.  Date: 06/02/2017 EKG Time:  Rate: 80 Rhythm: normal sinus rhythm QRS Axis: normal Intervals: normal ST/T Wave abnormalities: normal Narrative Interpretation: no evidence of acute ischemia  ____________________________________________  RADIOLOGY  chest x-ray with no acute disease ____________________________________________   PROCEDURES  Procedure(s) performed: no  Procedures  Critical Care performed: no  Observation: no ____________________________________________   INITIAL IMPRESSION / ASSESSMENT AND PLAN / ED COURSE  Pertinent labs & imaging results that were available during my care of the patient  were reviewed by me and considered in my medical decision making (see chart for details).  The patient arrives very well-appearing and hemodynamically stable. EKG is unremarkable and chest x-ray is normal. I had a lengthy discussion with the patient regarding her recent diagnosis of sleep apnea and that we would expect her to intermittently have difficulty breathing in her sleep. She is given reassurance and strict return precautions given. She is medically stable for outpatient management.      ____________________________________________   FINAL CLINICAL IMPRESSION(S) / ED DIAGNOSES  Final diagnoses:  Sleep apnea, unspecified type  Iron deficiency anemia, unspecified iron deficiency anemia type  Tobacco use      NEW MEDICATIONS STARTED DURING THIS VISIT:  Discharge Medication List as of 06/02/2017  5:00 AM    START taking these medications   Details  ferrous sulfate 325 (65 FE) MG EC tablet Take 1 tablet (325 mg total) by mouth 3 (three) times daily., Starting Wed 06/02/2017, Until Thu 06/02/2018, Print    pediatric multivitamin-fluoride (POLY-VI-FLOR) 0.25 MG chewable tablet Chew 1 tablet by mouth daily., Starting Wed 06/02/2017, Until Thu 06/02/2018, Print  Note:  This document was prepared using Dragon voice recognition software and may include unintentional dictation errors.     Darel Hong, MD 06/02/17 [phone]

## 2017-06-02 NOTE — ED Triage Notes (Signed)
Pt ambulatory to triage with steady gait, no distress noted. Pt reports she has recently been diagnosed with sleep apnea and while sleeping tonight she was woke up with 4 episodes of SOB and the feeling that she stopped breathing. Per pt, episodes are more frequent and she has also noticed SOB with exesion during the day when walking. Pts O2 in triage is 99% RA. Pt has not yet set up to get cpap.

## 2017-06-02 NOTE — Discharge Instructions (Signed)
Please resume taking iron 3 times a day along with a multivitamin to help you absorb iron better. Please return to your primary care physician and your [facility] gynecologist as scheduled for reevaluation and return to the emergency department for any concerns.  It was a pleasure to take care of you today, and thank you for coming to our emergency department.  If you have any questions or concerns before leaving please ask the nurse to grab me and I'm more than happy to go through your aftercare instructions again.  If you were prescribed any opioid pain medication today such as Norco, Vicodin, Percocet, morphine, hydrocodone, or oxycodone please make sure you do not drive when you are taking this medication as it can alter your ability to drive safely.  If you have any concerns once you are home that you are not improving or are in fact getting worse before you can make it to your follow-up appointment, please do not hesitate to call 911 and come back for further evaluation.  Darel Hong, MD  Results for orders placed or performed during the hospital encounter of 13/24/40  Basic metabolic panel  Result Value Ref Range   Sodium 137 135 - 145 mmol/L   Potassium 3.9 3.5 - 5.1 mmol/L   Chloride 106 101 - 111 mmol/L   CO2 22 22 - 32 mmol/L   Glucose, Bld 175 (H) 65 - 99 mg/dL   BUN 14 6 - 20 mg/dL   Creatinine, Ser 0.69 0.44 - 1.00 mg/dL   Calcium 8.5 (L) 8.9 - 10.3 mg/dL   GFR calc non Af Amer >60 >60 mL/min   GFR calc Af Amer >60 >60 mL/min   Anion gap 9 5 - 15  CBC  Result Value Ref Range   WBC 13.7 (H) 3.6 - 11.0 K/uL   RBC 3.13 (L) 3.80 - 5.20 MIL/uL   Hemoglobin 8.6 (L) 12.0 - 16.0 g/dL   HCT 26.4 (L) 35.0 - 47.0 %   MCV 84.3 80.0 - 100.0 fL   MCH 27.6 26.0 - 34.0 pg   MCHC 32.7 32.0 - 36.0 g/dL   RDW 16.0 (H) 11.5 - 14.5 %   Platelets 305 150 - 440 K/uL  Troponin I  Result Value Ref Range   Troponin I <0.03 <0.03 ng/mL   Dg Chest 2 View  Result Date: 06/02/2017 CLINICAL DATA:   Shortness of breath. EXAM: CHEST  2 VIEW COMPARISON:  10/25/2015 FINDINGS: The cardiomediastinal contours are normal. The lungs are clear. Pulmonary vasculature is normal. No consolidation, pleural effusion, or pneumothorax. No acute osseous abnormalities are seen. IMPRESSION: No active cardiopulmonary disease. Electronically Signed   By: Jeb Levering M.D.   On: 06/02/2017 04:00

## 2017-06-16 ENCOUNTER — Telehealth: Payer: Self-pay | Admitting: Gastroenterology

## 2017-06-16 NOTE — Telephone Encounter (Signed)
Patient needs to reschedule procedure. Please call

## 2017-06-17 ENCOUNTER — Telehealth: Payer: Self-pay | Admitting: Gastroenterology

## 2017-06-17 NOTE — Telephone Encounter (Signed)
Patient called back needing to speak to you

## 2017-06-18 ENCOUNTER — Other Ambulatory Visit: Payer: Self-pay

## 2017-06-18 DIAGNOSIS — Z9889 Other specified postprocedural states: Secondary | ICD-10-CM

## 2017-07-08 ENCOUNTER — Ambulatory Visit
Admission: RE | Admit: 2017-07-08 | Discharge: 2017-07-08 | Disposition: A | Discharge status: Home / Self Care | Payer: Managed Care, Other (non HMO) | Source: Ambulatory Visit | Attending: Gastroenterology | Admitting: Gastroenterology

## 2017-07-08 ENCOUNTER — Ambulatory Visit: Payer: Managed Care, Other (non HMO) | Admitting: Certified Registered"

## 2017-07-08 ENCOUNTER — Encounter
Admission: RE | Disposition: A | Discharge status: Home / Self Care | Payer: Self-pay | Source: Ambulatory Visit | Attending: Gastroenterology

## 2017-07-08 DIAGNOSIS — Z1211 Encounter for screening for malignant neoplasm of colon: Secondary | ICD-10-CM | POA: Insufficient documentation

## 2017-07-08 DIAGNOSIS — E119 Type 2 diabetes mellitus without complications: Secondary | ICD-10-CM | POA: Diagnosis not present

## 2017-07-08 DIAGNOSIS — I10 Essential (primary) hypertension: Secondary | ICD-10-CM | POA: Diagnosis not present

## 2017-07-08 DIAGNOSIS — F17210 Nicotine dependence, cigarettes, uncomplicated: Secondary | ICD-10-CM | POA: Diagnosis not present

## 2017-07-08 DIAGNOSIS — Z7984 Long term (current) use of oral hypoglycemic drugs: Secondary | ICD-10-CM | POA: Insufficient documentation

## 2017-07-08 DIAGNOSIS — J449 Chronic obstructive pulmonary disease, unspecified: Secondary | ICD-10-CM | POA: Diagnosis not present

## 2017-07-08 DIAGNOSIS — G4730 Sleep apnea, unspecified: Secondary | ICD-10-CM | POA: Diagnosis not present

## 2017-07-08 DIAGNOSIS — E7800 Pure hypercholesterolemia, unspecified: Secondary | ICD-10-CM | POA: Diagnosis not present

## 2017-07-08 DIAGNOSIS — D490 Neoplasm of unspecified behavior of digestive system: Secondary | ICD-10-CM | POA: Diagnosis not present

## 2017-07-08 DIAGNOSIS — Z9889 Other specified postprocedural states: Secondary | ICD-10-CM

## 2017-07-08 DIAGNOSIS — D125 Benign neoplasm of sigmoid colon: Secondary | ICD-10-CM | POA: Diagnosis not present

## 2017-07-08 DIAGNOSIS — Z86010 Personal history of colonic polyps: Secondary | ICD-10-CM | POA: Diagnosis not present

## 2017-07-08 DIAGNOSIS — Z79899 Other long term (current) drug therapy: Secondary | ICD-10-CM | POA: Diagnosis not present

## 2017-07-08 DIAGNOSIS — K219 Gastro-esophageal reflux disease without esophagitis: Secondary | ICD-10-CM | POA: Diagnosis not present

## 2017-07-08 HISTORY — DX: Polyp of colon: K63.5

## 2017-07-08 HISTORY — DX: Sleep apnea, unspecified: G47.30

## 2017-07-08 HISTORY — PX: FLEXIBLE SIGMOIDOSCOPY: SHX5431

## 2017-07-08 HISTORY — DX: Anemia, unspecified: D64.9

## 2017-07-08 HISTORY — DX: Chronic obstructive pulmonary disease, unspecified: J44.9

## 2017-07-08 HISTORY — DX: Gastro-esophageal reflux disease without esophagitis: K21.9

## 2017-07-08 LAB — GLUCOSE, CAPILLARY: Glucose-Capillary: 176 mg/dL — ABNORMAL HIGH (ref 65–99)

## 2017-07-08 LAB — POCT PREGNANCY, URINE: Preg Test, Ur: NEGATIVE

## 2017-07-08 SURGERY — SIGMOIDOSCOPY, FLEXIBLE
Anesthesia: General

## 2017-07-08 MED ORDER — MIDAZOLAM HCL 2 MG/2ML IJ SOLN
INTRAMUSCULAR | Status: DC | PRN
  Administered 2017-07-08: 2 mg via INTRAVENOUS

## 2017-07-08 MED ORDER — SODIUM CHLORIDE 0.9 % IV SOLN
INTRAVENOUS | Status: DC
  Administered 2017-07-08: 11:00:00 via INTRAVENOUS

## 2017-07-08 MED ORDER — MIDAZOLAM HCL 2 MG/2ML IJ SOLN
INTRAMUSCULAR | Status: AC
  Filled 2017-07-08: qty 2

## 2017-07-08 MED ORDER — PROPOFOL 10 MG/ML IV BOLUS
INTRAVENOUS | Status: AC
  Filled 2017-07-08: qty 20

## 2017-07-08 MED ORDER — LIDOCAINE HCL (PF) 2 % IJ SOLN
INTRAMUSCULAR | Status: AC
  Filled 2017-07-08: qty 10

## 2017-07-08 MED ORDER — PROPOFOL 500 MG/50ML IV EMUL
INTRAVENOUS | Status: DC | PRN
  Administered 2017-07-08: 150 ug/kg/min via INTRAVENOUS

## 2017-07-08 MED ORDER — LABETALOL HCL 5 MG/ML IV SOLN
INTRAVENOUS | Status: AC
Start: 2017-07-08 — End: ?
  Filled 2017-07-08: qty 4

## 2017-07-08 MED ORDER — LABETALOL HCL 5 MG/ML IV SOLN
INTRAVENOUS | Status: DC | PRN
  Administered 2017-07-08: 10 mg via INTRAVENOUS

## 2017-07-08 MED ORDER — LIDOCAINE HCL (CARDIAC) 20 MG/ML IV SOLN
INTRAVENOUS | Status: DC | PRN
  Administered 2017-07-08: 50 mg via INTRAVENOUS

## 2017-07-08 MED ORDER — PROPOFOL 10 MG/ML IV BOLUS
INTRAVENOUS | Status: DC | PRN
  Administered 2017-07-08: 60 mg via INTRAVENOUS
  Administered 2017-07-08: 30 mg via INTRAVENOUS

## 2017-07-08 NOTE — H&P (Signed)
[name] MD [address] [place], [place] [address] Phone: [phone] Fax : [phone]  Primary Care Physician:  Cletis Athens, MD Primary Gastroenterologist:  Dr. [name]   Pre-Procedure History & Physical: HPI:  Barbara Pierce is a 46 y.o. female is here for an flexible sigmoidoscopy.   Past Medical History:  Diagnosis Date  . Anemia   . Colon polyps   . COPD (chronic obstructive pulmonary disease) ([place])   . Diabetes mellitus without complication ([place])   . GERD (gastroesophageal reflux disease)   . High cholesterol   . Hypertension   . Sleep apnea     Past Surgical History:  Procedure Laterality Date  . COLONOSCOPY WITH PROPOFOL N/A 01/19/2017   Procedure: COLONOSCOPY WITH PROPOFOL;  Surgeon: [name], MD;  Location: [facility] ENDOSCOPY;  Service: Endoscopy;  Laterality: N/A;  . DILATION AND CURETTAGE OF UTERUS    . surgery right forearm      Prior to Admission medications   Medication Sig Start Date End Date Taking? Authorizing Provider  albuterol (PROVENTIL HFA;VENTOLIN HFA) 108 (90 Base) MCG/ACT inhaler Inhale into the lungs. 10/01/16  Yes [provider]  atorvastatin (LIPITOR) 20 MG tablet  12/07/16  Yes [provider]  ferrous sulfate 325 (65 FE) MG EC tablet Take 1 tablet (325 mg total) by mouth 3 (three) times daily. 06/02/17 06/02/18 Yes Darel Hong, MD  glipiZIDE (GLUCOTROL XL) 5 MG 24 hr tablet Take 5 mg by mouth daily. 04/27/17  Yes [provider]  JENCYCLA 0.35 MG tablet  05/04/17  Yes [provider]  lisinopril (PRINIVIL,ZESTRIL) 20 MG tablet Take 40 mg by mouth daily. 01/28/17  Yes [provider]  metFORMIN (GLUCOPHAGE) 500 MG tablet  12/07/16  Yes [provider]  pantoprazole (PROTONIX) 40 MG tablet Take 40 mg by mouth daily. 04/27/17  Yes [provider]  pediatric multivitamin-fluoride (POLY-VI-FLOR) 0.25 MG chewable tablet Chew 1 tablet by mouth daily. 06/02/17 06/02/18 Yes  Darel Hong, MD    Allergies as of 06/18/2017  . (No Known Allergies)    History reviewed. No pertinent family history.  Social History   Social History  . Marital status: Married    Spouse name: N/A  . Number of children: N/A  . Years of education: N/A   Occupational History  . Not on file.   Social History Main Topics  . Smoking status: Current Every Day Smoker    Packs/day: 0.50    Types: Cigarettes  . Smokeless tobacco: Never Used  . Alcohol use Yes     Comment: occassional 2 weeks aago,none last 24hrs  . Drug use: No  . Sexual activity: Not on file   Other Topics Concern  . Not on file   Social History Narrative  . No narrative on file    Review of Systems: See HPI, otherwise negative ROS  Physical Exam: BP 133/84   Pulse 84   Temp 98.4 F (36.9 C) (Tympanic)   Resp 16   Ht 4\' 9"  (1.448 m)   Wt 225 lb (102.1 kg)   SpO2 100%   BMI 48.69 kg/m  General:   Alert,  pleasant and cooperative in NAD Head:  Normocephalic and atraumatic. Neck:  Supple; no masses or thyromegaly. Lungs:  Clear throughout to auscultation.    Heart:  Regular rate and rhythm. Abdomen:  Soft, nontender and nondistended. Normal bowel sounds, without guarding, and without rebound.   Neurologic:  Alert and  oriented x4;  grossly normal neurologically.  Impression/Plan: Barbara Pierce is here for an flexible sigmoidoscopy to be performed for follow up of polypectomy   Risks, benefits, limitations, and alternatives regarding  flexible sigmoidoscopy have been reviewed with the patient.  Questions have been answered.  All parties agreeable.   [name], MD  07/08/2017, 11:31 AM

## 2017-07-08 NOTE — Anesthesia Procedure Notes (Addendum)
Performed by: Lance Muss Pre-anesthesia Checklist: Patient identified, Emergency Drugs available, Suction available, Patient being monitored and Timeout performed Patient Re-evaluated:Patient Re-evaluated prior to induction Oxygen Delivery Method: Nasal cannula Induction Type: IV induction Ventilation: Nasal airway inserted- appropriate to patient size

## 2017-07-08 NOTE — Anesthesia Preprocedure Evaluation (Signed)
Anesthesia Evaluation  Patient identified by MRN, date of birth, ID band Patient awake    Reviewed: Allergy & Precautions, H&P , NPO status , Patient's Chart, lab work & pertinent test results  History of Anesthesia Complications Negative for: history of anesthetic complications  Airway Mallampati: II  TM Distance: <3 FB Neck ROM: full    Dental  (+) Chipped   Pulmonary neg shortness of breath, sleep apnea , COPD, Current Smoker,           Cardiovascular Exercise Tolerance: Good hypertension, (-) angina(-) Past MI and (-) DOE      Neuro/Psych negative neurological ROS  negative psych ROS   GI/Hepatic Neg liver ROS, GERD  Medicated and Controlled,  Endo/Other  diabetes, Type 2  Renal/GU negative Renal ROS  negative genitourinary   Musculoskeletal   Abdominal   Peds  Hematology negative hematology ROS (+)   Anesthesia Other Findings Past Medical History: No date: Anemia No date: Colon polyps No date: COPD (chronic obstructive pulmonary disease) ([facility]) No date: Diabetes mellitus without complication ([facility]) No date: GERD (gastroesophageal reflux disease) No date: High cholesterol No date: Hypertension No date: Sleep apnea  Past Surgical History: 01/19/2017: COLONOSCOPY WITH PROPOFOL; N/A     Comment:  Procedure: COLONOSCOPY WITH PROPOFOL;  Surgeon: [name], MD;  Location: [facility] ENDOSCOPY;  Service: Endoscopy;              Laterality: N/A; No date: DILATION AND CURETTAGE OF UTERUS No date: surgery right forearm  BMI    Body Mass Index:  48.69 kg/m      Reproductive/Obstetrics negative OB ROS                             Anesthesia Physical Anesthesia Plan  ASA: III  Anesthesia Plan: General   Post-op Pain Management:    Induction: Intravenous  PONV Risk Score and Plan: Propofol infusion  Airway Management Planned: Natural Airway and Nasal  Cannula  Additional Equipment:   Intra-op Plan:   Post-operative Plan:   Informed Consent: I have reviewed the patients History and Physical, chart, labs and discussed the procedure including the risks, benefits and alternatives for the proposed anesthesia with the patient or authorized representative who has indicated his/her understanding and acceptance.   Dental Advisory Given  Plan Discussed with: Anesthesiologist, CRNA and Surgeon  Anesthesia Plan Comments: (Patient consented for risks of anesthesia including but not limited to:  - adverse reactions to medications - risk of intubation if required - damage to teeth, lips or other oral mucosa - sore throat or hoarseness - Damage to heart, brain, lungs or loss of life  Patient voiced understanding.)        Anesthesia Quick Evaluation

## 2017-07-08 NOTE — Op Note (Signed)
[facility] Patient Name: Barbara Pierce Procedure Date: 07/08/2017 11:33 AM MRN: [id number] Account #: [id number] Date of Birth: 1971/01/24 Admit Type: Outpatient Age: 46 Room: [facility] ENDO ROOM 3 Gender: Female Note Status: Finalized Procedure:            Flexible Sigmoidoscopy Indications:          Personal history of colonic polyps Providers:            [name] MD, MD Medicines:            Monitored Anesthesia Care Complications:        No immediate complications. Procedure:            Pre-Anesthesia Assessment:                       - Prior to the procedure, a History and Physical was                        performed, and patient medications, allergies and                        sensitivities were reviewed. The patient's tolerance of                        previous anesthesia was reviewed.                       - The risks and benefits of the procedure and the                        sedation options and risks were discussed with the                        patient. All questions were answered and informed                        consent was obtained.                       - ASA Grade Assessment: III - A patient with severe                        systemic disease.                       After obtaining informed consent, the scope was passed                        under direct vision. The Endoscope was introduced                        through the anus and advanced to the the left                        transverse colon. The flexible sigmoidoscopy was                        accomplished with ease. The patient tolerated the                        procedure well. The quality of the bowel preparation  was adequate. Findings:      The perianal and digital rectal examinations were normal.      A 15 mm polypoid lesion was found in the sigmoid colon. The lesion was       semi-pedunculated. No bleeding was present. Was visualized  better today       , appeared like a true pedunculated polyp. Not resected as colon not       prepped. Impression:           - Polypoid lesion in the sigmoid colon.                       - No specimens collected. Recommendation:       - 1. Repeat flexible sigmoidoscopy with aim to resect                        the sigmoid polyp. Will need full colonoscopy prep for                        the procedure. Procedure Code(s):    --- Professional ---                       [phone], Sigmoidoscopy, flexible; diagnostic, including                        collection of specimen(s) by brushing or washing, when                        performed (separate procedure) Diagnosis Code(s):    --- Professional ---                       D49.0, Neoplasm of unspecified behavior of digestive                        system                       Z86.010, Personal history of colonic polyps CPT copyright 2016 American Medical Association. All rights reserved. The codes documented in this report are preliminary and upon coder review may  be revised to meet current compliance requirements. [name], MD [name] MD, MD 07/08/2017 11:52:55 AM This report has been signed electronically. Number of Addenda: 0 Note Initiated On: 07/08/2017 11:33 AM Total Procedure Duration: 0 hours 13 minutes 2 seconds       [facility]

## 2017-07-08 NOTE — Anesthesia Postprocedure Evaluation (Signed)
Anesthesia Post Note  Patient: Barbara Pierce  Procedure(s) Performed: FLEXIBLE SIGMOIDOSCOPY (N/A )  Patient location during evaluation: Endoscopy Anesthesia Type: General Level of consciousness: awake and alert Pain management: pain level controlled Vital Signs Assessment: post-procedure vital signs reviewed and stable Respiratory status: spontaneous breathing, nonlabored ventilation, respiratory function stable and patient connected to nasal cannula oxygen Cardiovascular status: blood pressure returned to baseline and stable Postop Assessment: no apparent nausea or vomiting Anesthetic complications: no     Last Vitals:  Vitals:   07/08/17 1215 07/08/17 1225  BP: (!) 139/101 133/69  Pulse: 89 91  Resp: 16 20  Temp:    SpO2: 100% 100%    Last Pain:  Vitals:   07/08/17 1155  TempSrc: Tympanic                 Precious Haws Dakayla Disanti

## 2017-07-08 NOTE — Anesthesia Post-op Follow-up Note (Signed)
Anesthesia QCDR form completed.        

## 2017-07-08 NOTE — Transfer of Care (Signed)
Immediate Anesthesia Transfer of Care Note  Patient: Barbara Pierce  Procedure(s) Performed: FLEXIBLE SIGMOIDOSCOPY (N/A )  Patient Location: PACU  Anesthesia Type:General  Level of Consciousness: sedated and responds to stimulation  Airway & Oxygen Therapy: Patient Spontanous Breathing and Patient connected to nasal cannula oxygen  Post-op Assessment: Report given to RN and Post -op Vital signs reviewed and stable  Post vital signs: Reviewed and stable  Last Vitals:  Vitals:   07/08/17 1155 07/08/17 1156  BP: (!) 159/95 (!) 159/98  Pulse: 98   Resp: 17 20  Temp: (!) 36.2 C   SpO2: 100% 99%    Last Pain:  Vitals:   07/08/17 1155  TempSrc: Tympanic         Complications: No apparent anesthesia complications

## 2017-07-12 ENCOUNTER — Telehealth: Payer: Self-pay

## 2017-07-12 NOTE — Telephone Encounter (Signed)
-----   Message from [name], MD sent at 07/08/2017 12:04 PM EDT ----- Regarding: please arrange appointment   Barbara Pierce,  On her Flex sig I saw a polyp today which needs to come  In for repeat flex sig with full colonoscopy prep to take out the polyp  Regards    Dr [name]  Gastroenterology/Hepatology Pager: [phone]

## 2017-07-12 NOTE — Telephone Encounter (Signed)
Advised patient of results per Dr. Vicente Males.   Patient to callback and schedule colonoscopy for polyp removal.

## 2017-07-13 ENCOUNTER — Encounter: Payer: Self-pay | Admitting: Gastroenterology

## 2017-08-19 ENCOUNTER — Telehealth: Payer: Self-pay | Admitting: Gastroenterology

## 2017-08-19 NOTE — Telephone Encounter (Signed)
Left voice message for patient to call and schedule a office visit to discuss next steps with Dr. Vicente Males

## 2017-08-20 ENCOUNTER — Encounter: Payer: Self-pay | Admitting: Gastroenterology

## 2017-08-20 ENCOUNTER — Telehealth: Payer: Self-pay | Admitting: Gastroenterology

## 2017-08-20 NOTE — Telephone Encounter (Signed)
Left voice message for patient to call and schedule appt with Dr. Vicente Males to discuss further treatment. Letter sent

## 2017-09-02 ENCOUNTER — Ambulatory Visit (INDEPENDENT_AMBULATORY_CARE_PROVIDER_SITE_OTHER): Payer: Managed Care, Other (non HMO) | Admitting: Gastroenterology

## 2017-09-02 ENCOUNTER — Other Ambulatory Visit: Payer: Self-pay

## 2017-09-02 ENCOUNTER — Encounter: Payer: Self-pay | Admitting: Gastroenterology

## 2017-09-02 ENCOUNTER — Encounter (INDEPENDENT_AMBULATORY_CARE_PROVIDER_SITE_OTHER): Payer: Self-pay

## 2017-09-02 VITALS — BP 137/80 | HR 99 | Ht <= 58 in | Wt 227.6 lb

## 2017-09-02 DIAGNOSIS — Z86010 Personal history of colonic polyps: Secondary | ICD-10-CM | POA: Diagnosis not present

## 2017-09-02 NOTE — Progress Notes (Signed)
[name] MD, MRCP(U.K) [address]  [place]  [place], [place] [address]  Main: [phone]  Fax: [phone]   Primary Care Physician: Cletis Athens, MD  Primary Gastroenterologist:  Dr. [name]   No chief complaint on file.   HPI: Barbara Pierce is a 46 y.o. female     Summary of history :  She was initially seen on 12/28/16 for blood on her stools. Family history of colon polyps. I performed a colonoscopy on 01/19/17 . She had multiple polyps of the colon ranging from 10 mm to 20 mm which were resected and pathology returned as a tubular adenoma with no dysplasia. At the 25 cm mark she had an abnormal appearing segment which appeared to be a submucosal lesion- I tatooed the area but did not take any biopsies to avoid scar tissue.   Interval history/2018-08/2017    07/09/2017- A 15 mm polypoid lesion was found in the sigmoid colon. The lesion was semi-pedunculated. No bleeding was present.  appeared like a true pedunculated polyp. Not resected as colon not prepped.  Denies any symptoms.    Current Outpatient Medications  Medication Sig Dispense Refill  . albuterol (PROVENTIL HFA;VENTOLIN HFA) 108 (90 Base) MCG/ACT inhaler Inhale into the lungs.    Marland Kitchen atorvastatin (LIPITOR) 20 MG tablet     . ferrous sulfate 325 (65 FE) MG EC tablet Take 1 tablet (325 mg total) by mouth 3 (three) times daily. 90 tablet 0  . glipiZIDE (GLUCOTROL XL) 5 MG 24 hr tablet Take 5 mg by mouth daily.  4  . JENCYCLA 0.35 MG tablet     . lisinopril (PRINIVIL,ZESTRIL) 20 MG tablet Take 40 mg by mouth daily.  6  . metFORMIN (GLUCOPHAGE) 500 MG tablet     . pantoprazole (PROTONIX) 40 MG tablet Take 40 mg by mouth daily.  6  . pediatric multivitamin-fluoride (POLY-VI-FLOR) 0.25 MG chewable tablet Chew 1 tablet by mouth daily. 30 tablet 0   No current facility-administered medications for this visit.     Allergies as of 09/02/2017  . (No Known Allergies)    ROS:  General: Negative  for anorexia, weight loss, fever, chills, fatigue, weakness. ENT: Negative for hoarseness, difficulty swallowing , nasal congestion. CV: Negative for chest pain, angina, palpitations, dyspnea on exertion, peripheral edema.  Respiratory: Negative for dyspnea at rest, dyspnea on exertion, cough, sputum, wheezing.  GI: See history of present illness. GU:  Negative for dysuria, hematuria, urinary incontinence, urinary frequency, nocturnal urination.  Endo: Negative for unusual weight change.    Physical Examination:   There were no vitals taken for this visit.  General: Well-nourished, well-developed in no acute distress.  Eyes: No icterus. Conjunctivae pink. Mouth: Oropharyngeal mucosa moist and pink , no lesions erythema or exudate. Lungs: Clear to auscultation bilaterally. Non-labored. Heart: Regular rate and rhythm, no murmurs rubs or gallops.  Abdomen: Bowel sounds are normal, nontender, nondistended, no hepatosplenomegaly or masses, no abdominal bruits or hernia , no rebound or guarding.   Extremities: No lower extremity edema. No clubbing or deformities. Neuro: Alert and oriented x 3.  Grossly intact. Skin: Warm and dry, no jaundice.   Psych: Alert and cooperative, normal mood and affect.   Imaging Studies: No results found.  Assessment and Plan:   Barbara Pierce is a 46 y.o. y/o female  here to follow up to her recent signmoidoscopy where a pedunculated polyp was seen in the sigmoid colon. She will need repeat colonoscopy with polypectomy .  Plan  1. Colonoscopy  2. Advised to stop smoking    I have discussed alternative options, risks & benefits,  which include, but are not limited to, bleeding, infection, perforation,respiratory complication & drug reaction.  The patient agrees with this plan & written consent will be obtained.     Dr [name]  MD,MRCP [facility]) Follow up in 3 months

## 2017-11-15 IMAGING — CR DG CHEST 2V
2 series · 2 of 2 positions shown · non-contrast
Comparison: 10/25/2015

CLINICAL DATA: Shortness of breath.

EXAM:
CHEST  2 VIEW

[chest pa]
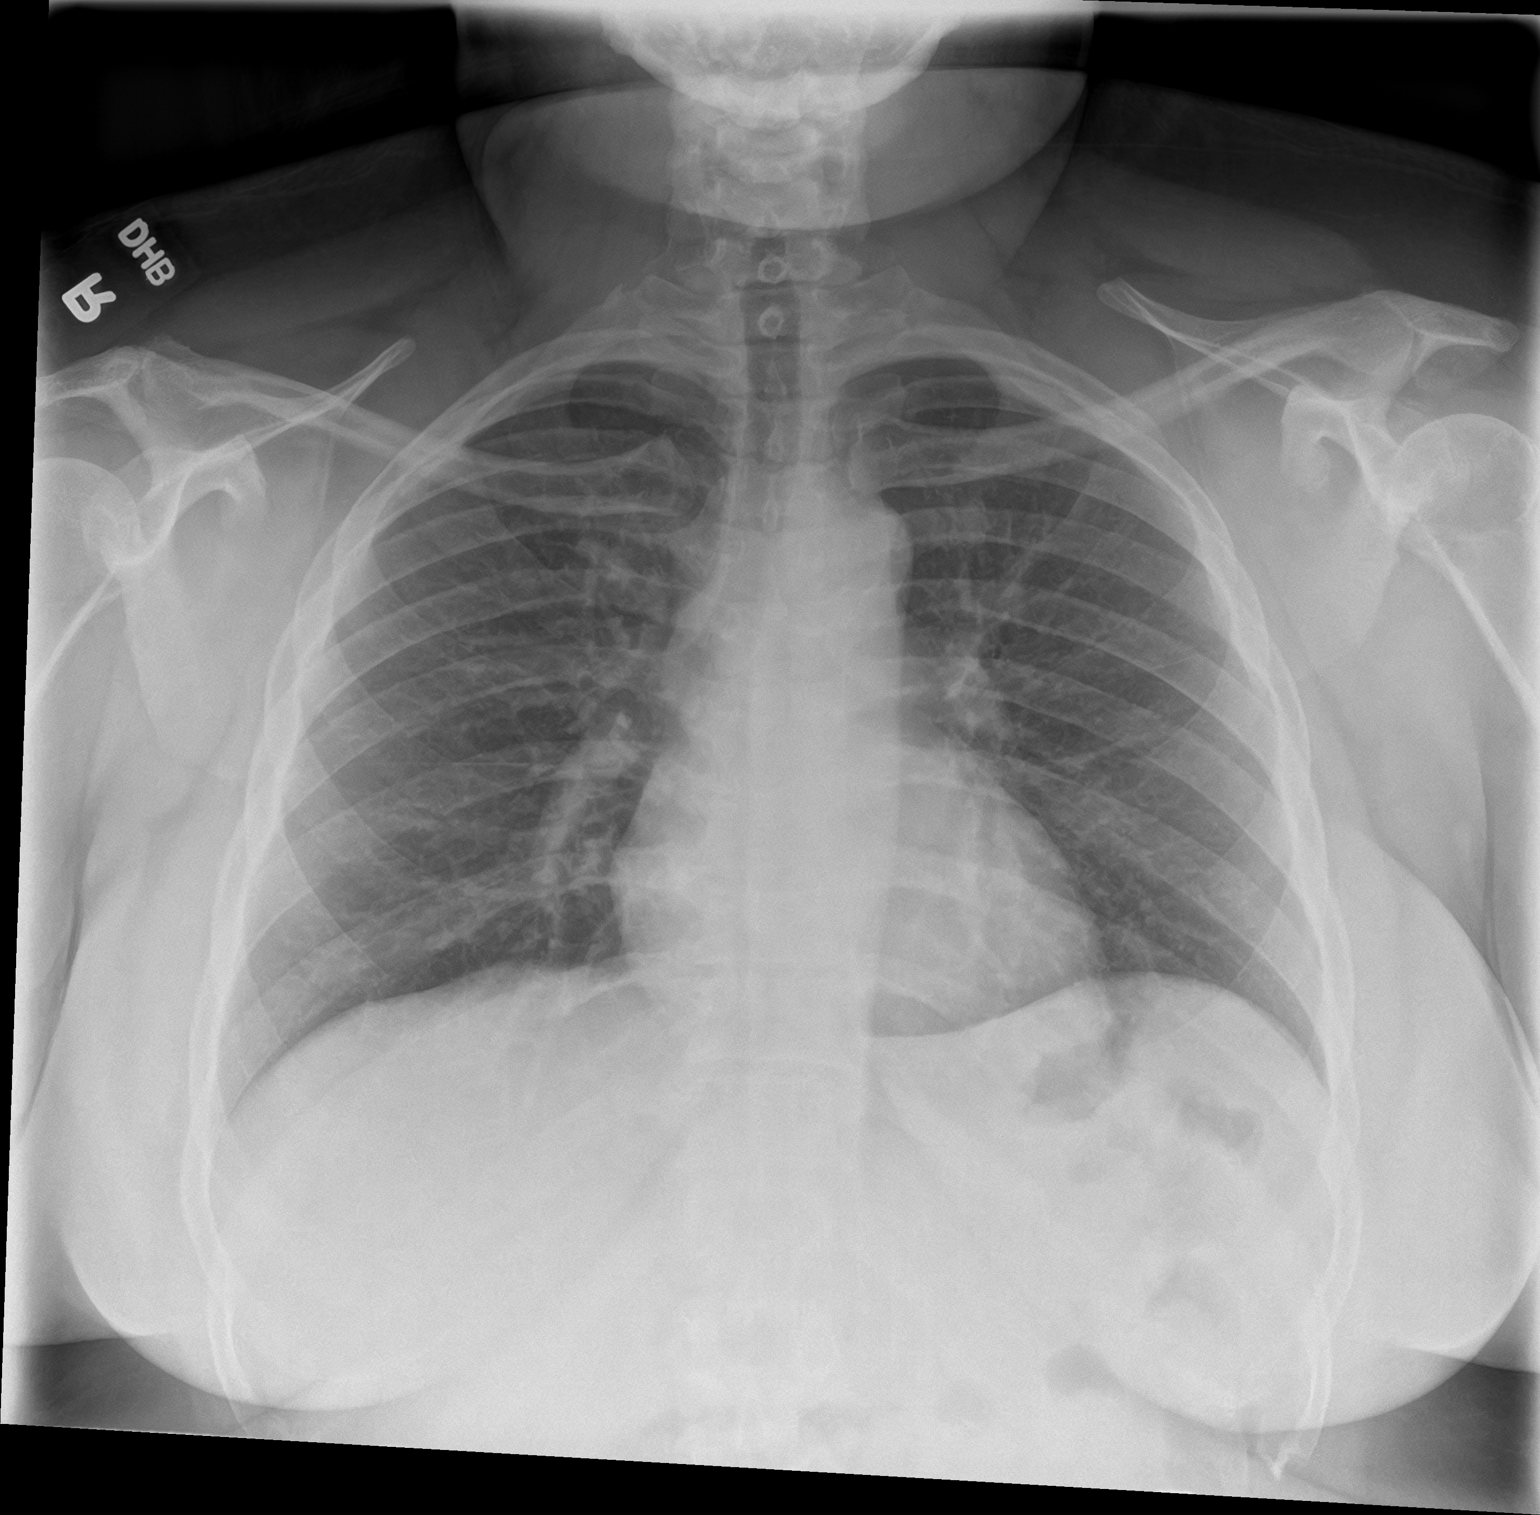

[chest lat]
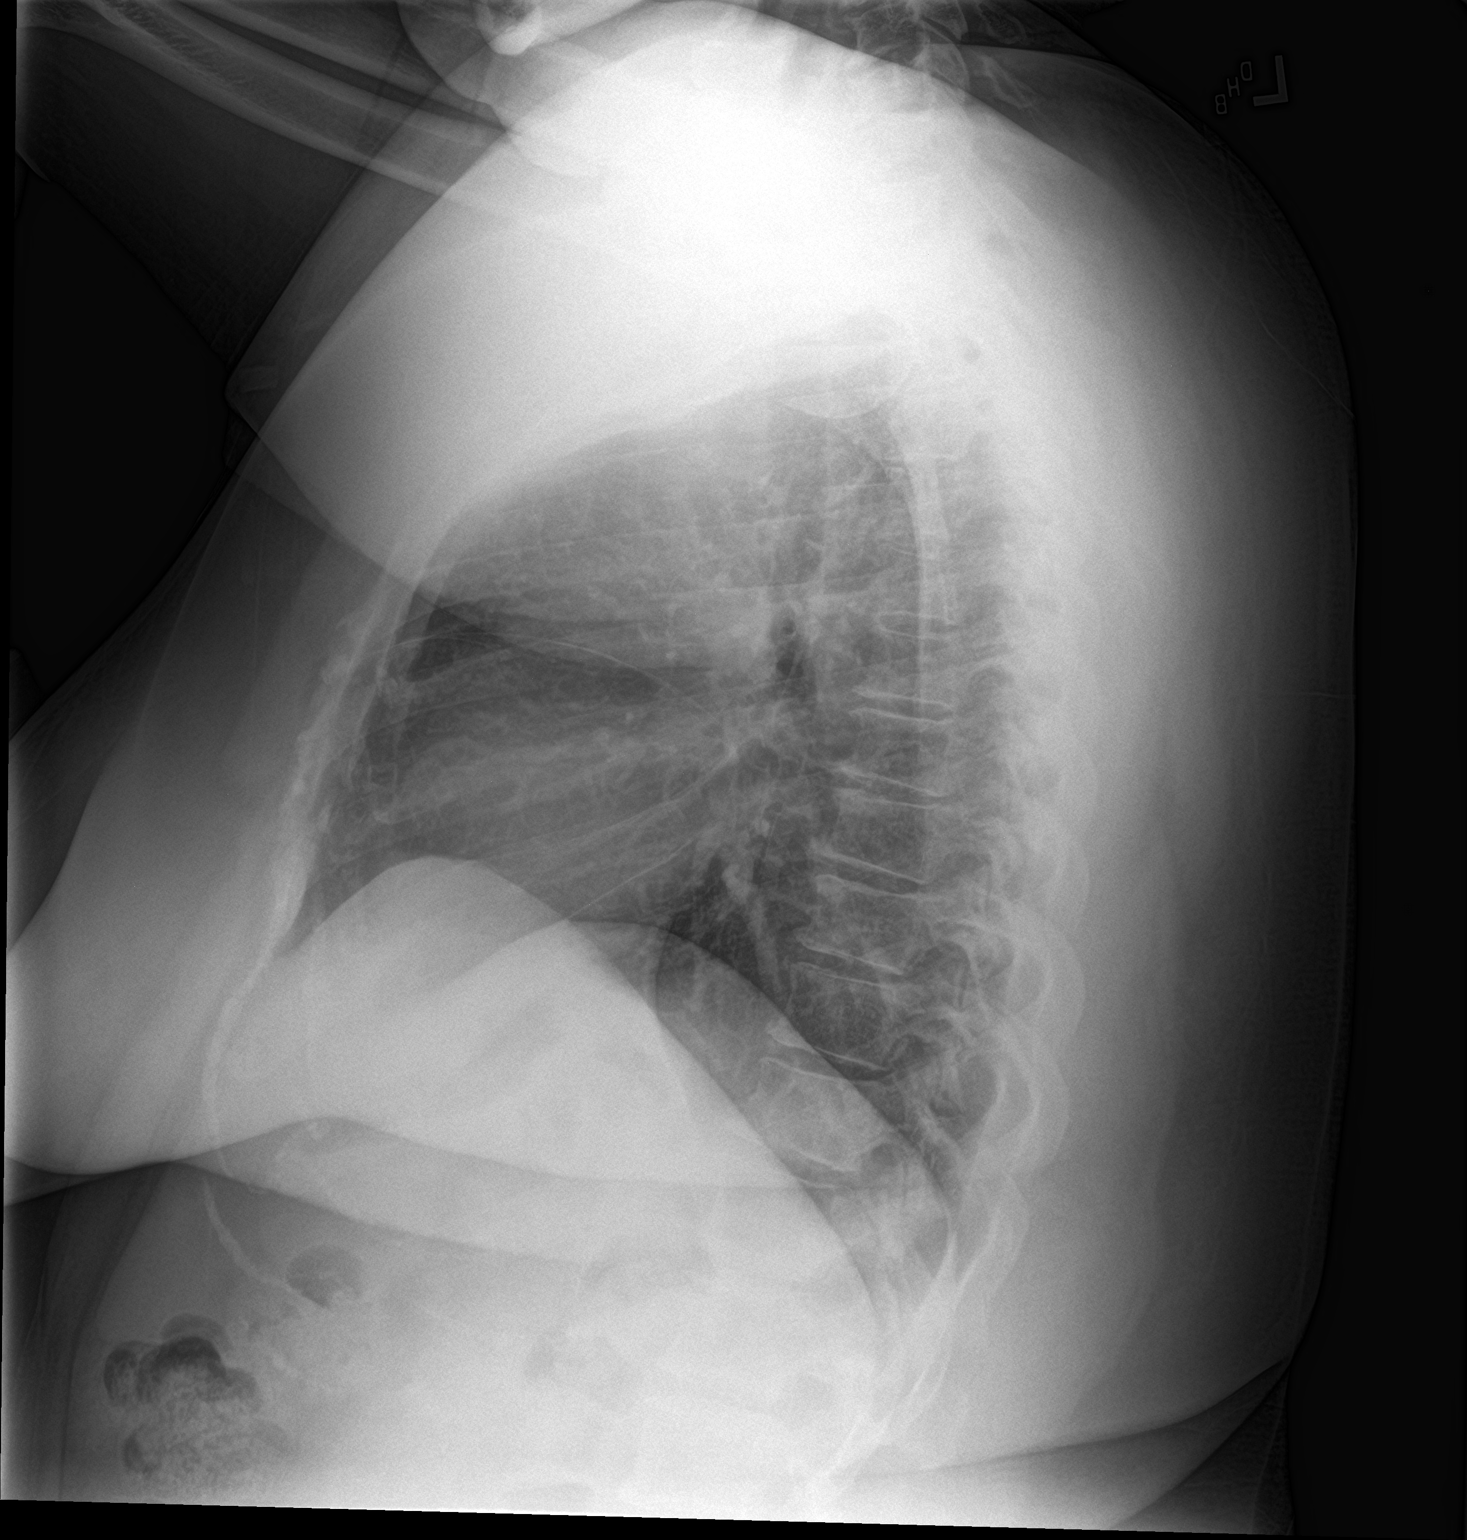

[2 of 2 positions shown; findings below may reference images not displayed]

FINDINGS: The cardiomediastinal contours are normal. The lungs are clear.
Pulmonary vasculature is normal. No consolidation, pleural effusion,
or pneumothorax. No acute osseous abnormalities are seen.
IMPRESSION: No active cardiopulmonary disease.

## 2018-01-05 ENCOUNTER — Encounter: Payer: Self-pay | Admitting: Dietician

## 2018-01-05 ENCOUNTER — Encounter: Payer: Managed Care, Other (non HMO) | Attending: Obstetrics and Gynecology | Admitting: Dietician

## 2018-01-05 VITALS — Ht <= 58 in | Wt 231.3 lb

## 2018-01-05 DIAGNOSIS — E119 Type 2 diabetes mellitus without complications: Secondary | ICD-10-CM

## 2018-01-05 DIAGNOSIS — Z713 Dietary counseling and surveillance: Secondary | ICD-10-CM | POA: Insufficient documentation

## 2018-01-05 DIAGNOSIS — E669 Obesity, unspecified: Secondary | ICD-10-CM | POA: Diagnosis not present

## 2018-01-05 DIAGNOSIS — E66813 Obesity, class 3: Secondary | ICD-10-CM

## 2018-01-05 DIAGNOSIS — Z6841 Body Mass Index (BMI) 40.0 and over, adult: Secondary | ICD-10-CM

## 2018-01-05 NOTE — Progress Notes (Signed)
Medical Nutrition Therapy: Visit start time: 1630  end time: 1730  Assessment:  Diagnosis: obesity, Type 2 Diabetes Past medical history: HTN, HLD, sleep apnea  Psychosocial issues/ stress concerns: none Preferred learning method:  . Auditory . Visual . Hands-on  Current weight: 231.3lbs Height: 4'9.5" Medications, supplements: reconciled list in medical record; patient states she has not seen a prescription for albuterol   Progress and evaluation: Patient reports recent weight increase since she quit smoking about 6 weeks ago. She has tried weight loss diets in the past, including keto diet, but unable to maintain. Looking for simple plan to fit within her lifestyle. No scheduled breaks at work.  Physical activity: no structured exercise.   Dietary Intake:  Usual eating pattern includes 3 meals and 1-2 snacks per day. Dining out frequency: 10-11 meals per week.  Breakfast: [place], or bag of chips/ pork rinds from convenience store; occasionally cereal at home Snack: none Lunch: 10:30-11am-- often out with coworkers-- pizza, hot dogs, etc.  Snack: almonds (more than 1/4 cup); m&ms occasionally Supper: husband cooks occasionally takeout food-- 4/16 grilled chicken only no veg; 4/14 spaghetti with meat balls; occasional fried food; chicken with rice Snack: sometimes if available-- m&ms, etc.  Beverages: had stopped sodas, but sometimes has soda with value meal; mostly water, crystal light.  Nutrition Care Education: Topics covered: weight control, diabetes Basic nutrition: basic food groups, appropriate nutrient balance, appropriate meal and snack schedule, general nutrition guidelines    Weight control: benefits of weight control, importance of low fat and low sugar food choices; portion control strategies; balanced meal options  And healthy snack options and portions; role of physical activity in weight control and metabolism; benefits of tracking food intake; provided guidance  for 1200kcal daily goal. Diabetes: appropriate meal and snack schedule, appropriate carb intake and balance  Nutritional Diagnosis:  [place]-2.2 Altered nutrition-related laboratory As related to Type 2 diabetes.  As evidenced by BGs often over 200mg /dl per patient report. [place]-3.3 Overweight/obesity As related to excess calories and inactivity.  As evidenced by patient with BMI of 49.  Intervention: Instruction as noted above.   Set goals with direction from patient.    Patient feels she is ready to make diet changes now that she has been able to quit smoking.    Education Materials given:  . Plate Planner with food lists . Sample menus . Goals/ instructions  Learner/ who was taught:  . Patient   Level of understanding: Marland Kitchen Verbalizes/ demonstrates competency  Demonstrated degree of understanding via:   Teach back Learning barriers: . None  Willingness to learn/ readiness for change: . Acceptance, ready for change  Monitoring and Evaluation:  Dietary intake, exercise, BG control, and body weight      follow up: 02/10/18

## 2018-01-05 NOTE — Patient Instructions (Signed)
   Consider preparing some meals in a jar (search for recipes online)  Use menus to prepare balanced meals.  Measure portions of high calorie foods, like M&Ms. Keep snacks to 100 calories or less.   Include fruit and or yogurt for some snacks in place of candy.   Plan is for 1200 calories daily -- or 300-500 calories each meal.

## 2018-02-10 ENCOUNTER — Ambulatory Visit: Payer: Managed Care, Other (non HMO) | Admitting: Dietician

## 2018-03-18 ENCOUNTER — Encounter: Payer: Self-pay | Admitting: Dietician

## 2018-03-18 NOTE — Progress Notes (Signed)
Have not heard back from patient to reschedule her appointment from 02/10/18, which she cancelled. Sent letter to referring provider.

## 2019-02-24 DIAGNOSIS — Z20828 Contact with and (suspected) exposure to other viral communicable diseases: Secondary | ICD-10-CM | POA: Diagnosis not present

## 2019-03-13 DIAGNOSIS — G4733 Obstructive sleep apnea (adult) (pediatric): Secondary | ICD-10-CM | POA: Diagnosis not present

## 2019-03-13 DIAGNOSIS — E1140 Type 2 diabetes mellitus with diabetic neuropathy, unspecified: Secondary | ICD-10-CM | POA: Diagnosis not present

## 2019-06-06 DIAGNOSIS — Z13220 Encounter for screening for lipoid disorders: Secondary | ICD-10-CM | POA: Diagnosis not present

## 2019-06-06 DIAGNOSIS — Z136 Encounter for screening for cardiovascular disorders: Secondary | ICD-10-CM | POA: Diagnosis not present

## 2019-06-06 DIAGNOSIS — Z1329 Encounter for screening for other suspected endocrine disorder: Secondary | ICD-10-CM | POA: Diagnosis not present

## 2019-06-06 DIAGNOSIS — Z1321 Encounter for screening for nutritional disorder: Secondary | ICD-10-CM | POA: Diagnosis not present

## 2019-06-06 DIAGNOSIS — Z131 Encounter for screening for diabetes mellitus: Secondary | ICD-10-CM | POA: Diagnosis not present

## 2019-06-06 DIAGNOSIS — Z01419 Encounter for gynecological examination (general) (routine) without abnormal findings: Secondary | ICD-10-CM | POA: Diagnosis not present

## 2019-08-15 DIAGNOSIS — R7309 Other abnormal glucose: Secondary | ICD-10-CM | POA: Diagnosis not present

## 2019-09-26 DIAGNOSIS — Z20828 Contact with and (suspected) exposure to other viral communicable diseases: Secondary | ICD-10-CM | POA: Diagnosis not present

## 2019-10-30 DIAGNOSIS — E785 Hyperlipidemia, unspecified: Secondary | ICD-10-CM | POA: Diagnosis not present

## 2019-10-30 DIAGNOSIS — K219 Gastro-esophageal reflux disease without esophagitis: Secondary | ICD-10-CM | POA: Diagnosis not present

## 2019-10-30 DIAGNOSIS — I10 Essential (primary) hypertension: Secondary | ICD-10-CM | POA: Diagnosis not present

## 2019-10-30 DIAGNOSIS — E1165 Type 2 diabetes mellitus with hyperglycemia: Secondary | ICD-10-CM | POA: Diagnosis not present

## 2019-10-30 DIAGNOSIS — E669 Obesity, unspecified: Secondary | ICD-10-CM | POA: Diagnosis not present

## 2019-11-20 DIAGNOSIS — K219 Gastro-esophageal reflux disease without esophagitis: Secondary | ICD-10-CM | POA: Diagnosis not present

## 2019-11-20 DIAGNOSIS — E1165 Type 2 diabetes mellitus with hyperglycemia: Secondary | ICD-10-CM | POA: Diagnosis not present

## 2019-11-20 DIAGNOSIS — I10 Essential (primary) hypertension: Secondary | ICD-10-CM | POA: Diagnosis not present

## 2019-11-20 DIAGNOSIS — R5383 Other fatigue: Secondary | ICD-10-CM | POA: Diagnosis not present

## 2019-11-20 DIAGNOSIS — E785 Hyperlipidemia, unspecified: Secondary | ICD-10-CM | POA: Diagnosis not present

## 2019-11-30 ENCOUNTER — Ambulatory Visit: Payer: 59 | Attending: Internal Medicine

## 2019-11-30 DIAGNOSIS — Z23 Encounter for immunization: Secondary | ICD-10-CM

## 2019-11-30 NOTE — Progress Notes (Signed)
   Covid-19 Vaccination Clinic  Name:  Barbara Pierce    MRN: [id number] DOB: Jan 23, 1971  11/30/2019  [name] was observed post Covid-19 immunization for 15 minutes without incident. She was provided with Vaccine Information Sheet and instruction to access the V-Safe system.   [name] was instructed to call 911 with any severe reactions post vaccine: Marland Kitchen Difficulty breathing  . Swelling of face and throat  . A fast heartbeat  . A bad rash all over body  . Dizziness and weakness   Immunizations Administered    Name Date Dose VIS Date Route   Pfizer COVID-19 Vaccine 11/30/2019  8:36 AM 0.3 mL 09/01/2019 Intramuscular   Manufacturer: [place]   Lot: [id number]   [place]: [id number]

## 2019-12-04 DIAGNOSIS — E119 Type 2 diabetes mellitus without complications: Secondary | ICD-10-CM | POA: Diagnosis not present

## 2019-12-26 ENCOUNTER — Ambulatory Visit: Payer: 59 | Attending: Internal Medicine

## 2019-12-26 DIAGNOSIS — Z23 Encounter for immunization: Secondary | ICD-10-CM

## 2019-12-26 NOTE — Progress Notes (Signed)
   Covid-19 Vaccination Clinic  Name:  Barbara Pierce    MRN: [id number] DOB: [birth year]  12/26/2019  Ms. Barbara Pierce was observed post Covid-19 immunization for 15 minutes without incident. She was provided with Vaccine Information Sheet and instruction to access the V-Safe system.   Ms. Barbara Pierce was instructed to call 911 with any severe reactions post vaccine: Marland Kitchen Difficulty breathing  . Swelling of face and throat  . A fast heartbeat  . A bad rash all over body  . Dizziness and weakness   Immunizations Administered    Name Date Dose VIS Date Route   Pfizer COVID-19 Vaccine 12/26/2019  8:44 AM 0.3 mL 09/01/2019 Intramuscular   Manufacturer: [place]   Lot: [id number]   [place]: [id number]

## 2020-02-12 DIAGNOSIS — I10 Essential (primary) hypertension: Secondary | ICD-10-CM | POA: Diagnosis not present

## 2020-02-12 DIAGNOSIS — E1165 Type 2 diabetes mellitus with hyperglycemia: Secondary | ICD-10-CM | POA: Diagnosis not present

## 2020-02-12 DIAGNOSIS — E669 Obesity, unspecified: Secondary | ICD-10-CM | POA: Diagnosis not present

## 2020-02-12 DIAGNOSIS — M25559 Pain in unspecified hip: Secondary | ICD-10-CM | POA: Diagnosis not present

## 2020-02-12 DIAGNOSIS — E785 Hyperlipidemia, unspecified: Secondary | ICD-10-CM | POA: Diagnosis not present

## 2020-02-12 DIAGNOSIS — K219 Gastro-esophageal reflux disease without esophagitis: Secondary | ICD-10-CM | POA: Diagnosis not present

## 2020-02-12 DIAGNOSIS — M25552 Pain in left hip: Secondary | ICD-10-CM | POA: Diagnosis not present

## 2020-02-13 ENCOUNTER — Encounter: Payer: Self-pay | Admitting: Emergency Medicine

## 2020-02-13 ENCOUNTER — Emergency Department: Payer: 59

## 2020-02-13 DIAGNOSIS — R0789 Other chest pain: Secondary | ICD-10-CM | POA: Insufficient documentation

## 2020-02-13 DIAGNOSIS — J449 Chronic obstructive pulmonary disease, unspecified: Secondary | ICD-10-CM | POA: Diagnosis not present

## 2020-02-13 DIAGNOSIS — Z7984 Long term (current) use of oral hypoglycemic drugs: Secondary | ICD-10-CM | POA: Diagnosis not present

## 2020-02-13 DIAGNOSIS — Z79899 Other long term (current) drug therapy: Secondary | ICD-10-CM | POA: Insufficient documentation

## 2020-02-13 DIAGNOSIS — K219 Gastro-esophageal reflux disease without esophagitis: Secondary | ICD-10-CM | POA: Insufficient documentation

## 2020-02-13 DIAGNOSIS — E119 Type 2 diabetes mellitus without complications: Secondary | ICD-10-CM | POA: Diagnosis not present

## 2020-02-13 DIAGNOSIS — F17210 Nicotine dependence, cigarettes, uncomplicated: Secondary | ICD-10-CM | POA: Insufficient documentation

## 2020-02-13 DIAGNOSIS — R079 Chest pain, unspecified: Secondary | ICD-10-CM | POA: Diagnosis not present

## 2020-02-13 DIAGNOSIS — I10 Essential (primary) hypertension: Secondary | ICD-10-CM | POA: Diagnosis not present

## 2020-02-13 DIAGNOSIS — R69 Illness, unspecified: Secondary | ICD-10-CM | POA: Diagnosis not present

## 2020-02-13 LAB — BASIC METABOLIC PANEL
Anion gap: 9 (ref 5–15)
BUN: 19 mg/dL (ref 6–20)
CO2: 24 mmol/L (ref 22–32)
Calcium: 8.8 mg/dL — ABNORMAL LOW (ref 8.9–10.3)
Chloride: 105 mmol/L (ref 98–111)
Creatinine, Ser: 1.09 mg/dL — ABNORMAL HIGH (ref 0.44–1.00)
GFR calc Af Amer: >60 mL/min (ref >60)
GFR calc non Af Amer: 60 mL/min — ABNORMAL LOW (ref >60)
Glucose, Bld: 204 mg/dL — ABNORMAL HIGH (ref 70–99)
Potassium: 4.1 mmol/L (ref 3.5–5.1)
Sodium: 138 mmol/L (ref 135–145)

## 2020-02-13 LAB — CBC
HCT: 31.6 % — ABNORMAL LOW (ref 36.0–46.0)
Hemoglobin: 10.5 g/dL — ABNORMAL LOW (ref 12.0–15.0)
MCH: 30.8 pg (ref 26.0–34.0)
MCHC: 33.2 g/dL (ref 30.0–36.0)
MCV: 92.7 fL (ref 80.0–100.0)
Platelets: 265 10*3/uL (ref 150–400)
RBC: 3.41 MIL/uL — ABNORMAL LOW (ref 3.87–5.11)
RDW: 14.8 % (ref 11.5–15.5)
WBC: 14.3 10*3/uL — ABNORMAL HIGH (ref 4.0–10.5)
nRBC: 0 % (ref 0.0–0.2)

## 2020-02-13 LAB — TROPONIN I (HIGH SENSITIVITY): Troponin I (High Sensitivity): 3 ng/L (ref <18)

## 2020-02-13 NOTE — ED Triage Notes (Signed)
Pt c/o constant sharp pain over the left side of chest that radiates into thew left side of neck, started this AM. Pt denies SOB and N/V.

## 2020-02-14 ENCOUNTER — Emergency Department
Admission: EM | Admit: 2020-02-14 | Discharge: 2020-02-14 | Disposition: A | Discharge status: Home / Self Care | Payer: 59 | Attending: Emergency Medicine | Admitting: Emergency Medicine

## 2020-02-14 DIAGNOSIS — R0789 Other chest pain: Secondary | ICD-10-CM

## 2020-02-14 LAB — TROPONIN I (HIGH SENSITIVITY): Troponin I (High Sensitivity): <2 ng/L (ref <18)

## 2020-02-14 NOTE — ED Notes (Signed)
Pt reports chest pain that started when she woke up yesterday. States that she routinely has pain in the chest when she wakes up due to her CPAP. Pt is reporting that she believes the pain is from how she sleeps and questions if she has bruised her chest in her sleep by sleeping on her side. Pain is not reproducible with palpation. Pt denies SOB

## 2020-02-14 NOTE — ED Provider Notes (Signed)
[facility] Emergency Department Provider Note  ____________________________________________  Time seen: Approximately 6:59 AM  I have reviewed the triage vital signs and the nursing notes.   HISTORY  Chief Complaint Chest Pain    HPI Barbara Pierce is a 49 y.o. female with a history of COPD diabetes GERD hypertension who comes the ED complaining of chest pain diffusely in the center and left side of the chest and left superior shoulder.  Not specifically radiating.  No shortness of breath diaphoresis or vomiting.  Not exertional, not pleuritic.  Worse when smoking cigarettes, better with drinking coffee.  Compliant with GERD medication and other medications.  Symptoms started 36 hours ago, constant.    Past Medical History:  Diagnosis Date  . Anemia   . Colon polyps   . COPD (chronic obstructive pulmonary disease) ([place])   . Diabetes mellitus without complication ([place])   . GERD (gastroesophageal reflux disease)   . High cholesterol   . Hypertension   . Sleep apnea      Patient Active Problem List   Diagnosis Date Noted  . Impingement syndrome of shoulder region 08/05/2016     Past Surgical History:  Procedure Laterality Date  . COLONOSCOPY WITH PROPOFOL N/A 01/19/2017   Procedure: COLONOSCOPY WITH PROPOFOL;  Surgeon: [name], MD;  Location: [facility] ENDOSCOPY;  Service: Endoscopy;  Laterality: N/A;  . DILATION AND CURETTAGE OF UTERUS    . FLEXIBLE SIGMOIDOSCOPY N/A 07/08/2017   Procedure: FLEXIBLE SIGMOIDOSCOPY;  Surgeon: [name], MD;  Location: [facility] ENDOSCOPY;  Service: Gastroenterology;  Laterality: N/A;  . surgery right forearm       Prior to Admission medications   Medication Sig Start Date End Date Taking? Authorizing Provider  albuterol (PROVENTIL HFA;VENTOLIN HFA) 108 (90 Base) MCG/ACT inhaler Inhale into the lungs. 10/01/16   [provider]  atorvastatin (LIPITOR) 20 MG tablet  12/07/16   [provider]   ferrous sulfate 325 (65 FE) MG EC tablet Take 1 tablet (325 mg total) by mouth 3 (three) times daily. 06/02/17 06/02/18  Darel Hong, MD  glipiZIDE (GLUCOTROL XL) 5 MG 24 hr tablet Take 5 mg by mouth daily. 04/27/17   [provider]  JENCYCLA 0.35 MG tablet  05/04/17   [provider]  lisinopril (PRINIVIL,ZESTRIL) 20 MG tablet Take 40 mg by mouth daily. 01/28/17   [provider]  metFORMIN (GLUCOPHAGE) 500 MG tablet  12/07/16   [provider]  Multiple Vitamins-Minerals (EMERGEN-C IMMUNE PO) Take by mouth.    [provider]  ONE TOUCH ULTRA TEST test strip  12/08/17   [provider]  ONETOUCH DELICA LANCETS [id number] MISC  12/08/17   [provider]  pantoprazole (PROTONIX) 40 MG tablet Take 40 mg by mouth daily. 04/27/17   [provider]     Allergies Patient has no known allergies.   History reviewed. No pertinent family history.  Social History Social History   Tobacco Use  . Smoking status: Former Smoker    Packs/day: 0.50    Types: Cigarettes    Quit date: 11/23/2017    Years since quitting: 2.2  . Smokeless tobacco: Never Used  Substance Use Topics  . Alcohol use: Yes    Comment: occassional, less than once a week  . Drug use: No    Review of Systems  Constitutional:   No fever or chills.  ENT:   No sore throat. No rhinorrhea. Cardiovascular: Positive chest pain as above without palpitations or syncope. Respiratory:  No dyspnea or cough. Gastrointestinal:   Negative for abdominal pain, vomiting and diarrhea.  Musculoskeletal:   Negative for focal pain or swelling All other systems reviewed and are negative except as documented above in ROS and HPI.  ____________________________________________   PHYSICAL EXAM:  VITAL SIGNS: ED Triage Vitals [02/13/20 2231]  Enc Vitals Group     BP 109/76     Pulse Rate 99     Resp 16     Temp 98.7 F (37.1 C)     Temp Source Oral     SpO2 97 %      Weight      Height      Head Circumference      Peak Flow      Pain Score      Pain Loc      Pain Edu?      Excl. in [place]?     Vital signs reviewed, nursing assessments reviewed.   Constitutional:   Alert and oriented. Non-toxic appearance. Eyes:   Conjunctivae are normal. EOMI. PERRL. ENT      Head:   Normocephalic and atraumatic.      Nose:   Normal.      Mouth/Throat: Normal, moist mucosa, no erythema.      Neck:   No meningismus. Full ROM. Hematological/Lymphatic/Immunilogical:   No cervical lymphadenopathy. Cardiovascular:   RRR. Symmetric bilateral radial and DP pulses.  No murmurs. Cap refill less than 2 seconds. Respiratory:   Normal respiratory effort without tachypnea/retractions. Breath sounds are clear and equal bilaterally. No wheezes/rales/rhonchi. Gastrointestinal:   Soft and nontender. Non distended. There is no CVA tenderness.  No rebound, rigidity, or guarding. Musculoskeletal:   Normal range of motion in all extremities. No joint effusions.  No lower extremity tenderness.  No edema.  Symmetric calf circumference, negative Hom[place] Neurologic:   Normal speech and language.  Motor grossly intact. No acute focal neurologic deficits are appreciated.  Skin:    Skin is warm, dry and intact. No rash noted.  No petechiae, purpura, or bullae.  ____________________________________________    LABS (pertinent positives/negatives) (all labs ordered are listed, but only abnormal results are displayed) Labs Reviewed  BASIC METABOLIC PANEL - Abnormal; Notable for the following components:      Result Value   Glucose, Bld 204 (*)    Creatinine, Ser 1.09 (*)    Calcium 8.8 (*)    GFR calc non Af Amer 60 (*)    All other components within normal limits  CBC - Abnormal; Notable for the following components:   WBC 14.3 (*)    RBC 3.41 (*)    Hemoglobin 10.5 (*)    HCT 31.6 (*)    All other components within normal limits  POC URINE PREG, ED  TROPONIN I (HIGH SENSITIVITY)   TROPONIN I (HIGH SENSITIVITY)   ____________________________________________   EKG  Interpreted by me Normal sinus rhythm rate of 99, normal axis and intervals.  Poor R wave progression.  Normal ST segments and T waves.  Voltage criteria for LVH in the high lateral leads.  ____________________________________________    RADIOLOGY  DG Chest 2 View  Result Date: 02/13/2020 CLINICAL DATA:  Constant sharp left-sided chest pain radiating to left neck EXAM: CHEST - 2 VIEW COMPARISON:  06/02/2017 FINDINGS: The heart size and mediastinal contours are within normal limits. Both lungs are clear. The visualized skeletal structures are unremarkable. IMPRESSION: No active cardiopulmonary disease. Electronically Signed   By: Diana Eves.D.  On: 02/13/2020 23:02    ____________________________________________   PROCEDURES Procedures  ____________________________________________  DIFFERENTIAL DIAGNOSIS   Non-STEMI, bronchitis, chest wall pain, GERD  CLINICAL IMPRESSION / ASSESSMENT AND PLAN / ED COURSE  Medications ordered in the ED: Medications - No data to display  Pertinent labs & imaging results that were available during my care of the patient were reviewed by me and considered in my medical decision making (see chart for details).  Barbara Pierce was evaluated in Emergency Department on 02/14/2020 for the symptoms described in the history of present illness. She was evaluated in the context of the global COVID-19 pandemic, which necessitated consideration that the patient might be at risk for infection with the SARS-CoV-2 virus that causes COVID-19. Institutional protocols and algorithms that pertain to the evaluation of patients at risk for COVID-19 are in a state of rapid change based on information released by regulatory bodies including the CDC and federal and state organizations. These policies and algorithms were followed during the patient's care in the ED.   Patient  presents with atypical, noncardiac chest pain, continuous for 36 hours.  Vital signs are normal, patient is nontoxic, calm and comfortable, reassuring exam.  EKG, chest x-ray, labs are all unremarkable.  Given the duration of symptoms and low risk HPI, single high-sensitivity troponin is sufficient for AMI rule out.   Considering the patient's symptoms, medical history, and physical examination today, I have low suspicion for ACS, PE, TAD, pneumothorax, carditis, mediastinitis, pneumonia, CHF, or sepsis.  Recommend adding Tums or Maalox, heat therapy, Tylenol.  Follow-up with primary care.        ____________________________________________   FINAL CLINICAL IMPRESSION(S) / ED DIAGNOSES    Final diagnoses:  Atypical chest pain     ED Discharge Orders    None      Portions of this note were generated with dragon dictation software. Dictation errors may occur despite best attempts at proofreading.   Carrie Mew, MD 02/14/20 [phone]

## 2020-03-07 DIAGNOSIS — R05 Cough: Secondary | ICD-10-CM | POA: Diagnosis not present

## 2020-05-13 DIAGNOSIS — K219 Gastro-esophageal reflux disease without esophagitis: Secondary | ICD-10-CM | POA: Diagnosis not present

## 2020-05-13 DIAGNOSIS — I10 Essential (primary) hypertension: Secondary | ICD-10-CM | POA: Diagnosis not present

## 2020-05-13 DIAGNOSIS — Z0000 Encounter for general adult medical examination without abnormal findings: Secondary | ICD-10-CM | POA: Diagnosis not present

## 2020-05-13 DIAGNOSIS — E785 Hyperlipidemia, unspecified: Secondary | ICD-10-CM | POA: Diagnosis not present

## 2020-05-13 DIAGNOSIS — E559 Vitamin D deficiency, unspecified: Secondary | ICD-10-CM | POA: Diagnosis not present

## 2020-05-13 DIAGNOSIS — R5383 Other fatigue: Secondary | ICD-10-CM | POA: Diagnosis not present

## 2020-05-13 DIAGNOSIS — E119 Type 2 diabetes mellitus without complications: Secondary | ICD-10-CM | POA: Diagnosis not present

## 2020-06-05 DIAGNOSIS — Z01419 Encounter for gynecological examination (general) (routine) without abnormal findings: Secondary | ICD-10-CM | POA: Diagnosis not present

## 2020-06-05 DIAGNOSIS — Z124 Encounter for screening for malignant neoplasm of cervix: Secondary | ICD-10-CM | POA: Diagnosis not present

## 2020-07-01 ENCOUNTER — Other Ambulatory Visit: Payer: Self-pay | Admitting: *Deleted

## 2020-07-01 NOTE — Patient Outreach (Signed)
[facility]) Care Management  07/01/2020  Barbara Pierce 22-Oct-1970 [id number]  Telephone outreach, insurance provider referral for HTN.  Left message and requested a return call.  [name], MSN, [facility] Gerontological Nurse Practitioner [facility] Care Management [phone]

## 2020-07-05 ENCOUNTER — Other Ambulatory Visit: Payer: Self-pay | Admitting: *Deleted

## 2020-07-05 NOTE — Patient Outreach (Signed)
[facility]) Care Management  07/05/2020  [name] 1971/08/30 [id number]  Second outreach for [place] Referral.  Barbara Pierce had an ED visit for CP in May. She has a hx of DM, COPD, GERD and HTN. Current smoker. She was evaluated and no cardiac cause was found, probable GERD.  Today, NP was able to talk with [name]. Explained [facility] Care Management program. Pt said she didn't need these services. She states she sees her MD for all of her medical needs.  Will send her our information for future reference.  [name], MSN, [facility] Gerontological Nurse Practitioner [facility] Care Management [phone]

## 2020-08-12 DIAGNOSIS — K219 Gastro-esophageal reflux disease without esophagitis: Secondary | ICD-10-CM | POA: Diagnosis not present

## 2020-08-12 DIAGNOSIS — I10 Essential (primary) hypertension: Secondary | ICD-10-CM | POA: Diagnosis not present

## 2020-08-12 DIAGNOSIS — E119 Type 2 diabetes mellitus without complications: Secondary | ICD-10-CM | POA: Diagnosis not present

## 2020-08-12 DIAGNOSIS — E785 Hyperlipidemia, unspecified: Secondary | ICD-10-CM | POA: Diagnosis not present

## 2020-08-12 DIAGNOSIS — D509 Iron deficiency anemia, unspecified: Secondary | ICD-10-CM | POA: Diagnosis not present

## 2020-08-12 DIAGNOSIS — E669 Obesity, unspecified: Secondary | ICD-10-CM | POA: Diagnosis not present

## 2020-08-12 DIAGNOSIS — E559 Vitamin D deficiency, unspecified: Secondary | ICD-10-CM | POA: Diagnosis not present

## 2020-08-14 DIAGNOSIS — I498 Other specified cardiac arrhythmias: Secondary | ICD-10-CM | POA: Diagnosis not present

## 2020-08-14 DIAGNOSIS — Z7984 Long term (current) use of oral hypoglycemic drugs: Secondary | ICD-10-CM | POA: Diagnosis not present

## 2020-08-14 DIAGNOSIS — R0989 Other specified symptoms and signs involving the circulatory and respiratory systems: Secondary | ICD-10-CM | POA: Diagnosis not present

## 2020-08-14 DIAGNOSIS — R69 Illness, unspecified: Secondary | ICD-10-CM | POA: Diagnosis not present

## 2020-08-14 DIAGNOSIS — R072 Precordial pain: Secondary | ICD-10-CM | POA: Diagnosis not present

## 2020-08-14 DIAGNOSIS — E119 Type 2 diabetes mellitus without complications: Secondary | ICD-10-CM | POA: Diagnosis not present

## 2020-08-14 DIAGNOSIS — R7989 Other specified abnormal findings of blood chemistry: Secondary | ICD-10-CM | POA: Diagnosis not present

## 2020-08-14 DIAGNOSIS — R918 Other nonspecific abnormal finding of lung field: Secondary | ICD-10-CM | POA: Diagnosis not present

## 2020-08-14 DIAGNOSIS — I10 Essential (primary) hypertension: Secondary | ICD-10-CM | POA: Diagnosis not present

## 2020-08-14 DIAGNOSIS — E7800 Pure hypercholesterolemia, unspecified: Secondary | ICD-10-CM | POA: Diagnosis not present

## 2020-08-14 DIAGNOSIS — R079 Chest pain, unspecified: Secondary | ICD-10-CM | POA: Diagnosis not present

## 2020-08-14 DIAGNOSIS — R791 Abnormal coagulation profile: Secondary | ICD-10-CM | POA: Diagnosis not present

## 2020-08-14 DIAGNOSIS — R9431 Abnormal electrocardiogram [ECG] [EKG]: Secondary | ICD-10-CM | POA: Diagnosis not present

## 2020-08-14 DIAGNOSIS — R61 Generalized hyperhidrosis: Secondary | ICD-10-CM | POA: Diagnosis not present

## 2020-10-02 ENCOUNTER — Other Ambulatory Visit: Payer: Self-pay | Admitting: Nephrology

## 2020-10-02 DIAGNOSIS — E1122 Type 2 diabetes mellitus with diabetic chronic kidney disease: Secondary | ICD-10-CM

## 2020-10-02 DIAGNOSIS — R809 Proteinuria, unspecified: Secondary | ICD-10-CM

## 2020-10-02 DIAGNOSIS — N1831 Chronic kidney disease, stage 3a: Secondary | ICD-10-CM

## 2020-10-02 DIAGNOSIS — R8290 Unspecified abnormal findings in urine: Secondary | ICD-10-CM

## 2020-10-14 ENCOUNTER — Ambulatory Visit: Payer: 59

## 2020-10-21 ENCOUNTER — Other Ambulatory Visit: Payer: Self-pay

## 2020-10-21 ENCOUNTER — Ambulatory Visit
Admission: RE | Admit: 2020-10-21 | Discharge: 2020-10-21 | Disposition: A | Discharge status: Home / Self Care | Payer: 59 | Source: Ambulatory Visit | Attending: Nephrology | Admitting: Nephrology

## 2020-10-21 DIAGNOSIS — E1122 Type 2 diabetes mellitus with diabetic chronic kidney disease: Secondary | ICD-10-CM | POA: Diagnosis present

## 2020-10-21 DIAGNOSIS — N1831 Chronic kidney disease, stage 3a: Secondary | ICD-10-CM | POA: Insufficient documentation

## 2020-10-21 DIAGNOSIS — R8290 Unspecified abnormal findings in urine: Secondary | ICD-10-CM | POA: Diagnosis not present

## 2020-10-21 DIAGNOSIS — R809 Proteinuria, unspecified: Secondary | ICD-10-CM | POA: Insufficient documentation

## 2020-11-21 ENCOUNTER — Telehealth: Payer: Self-pay | Admitting: Gastroenterology

## 2020-11-21 NOTE — Telephone Encounter (Signed)
Barbara Pierce at Dr. Guerry Bruin office called and wants to know when is she due for another colonoscopy. Please call Barbara Pierce at [phone]. It is not clear when she is due back.

## 2020-12-02 ENCOUNTER — Other Ambulatory Visit: Payer: Self-pay

## 2020-12-02 ENCOUNTER — Ambulatory Visit (INDEPENDENT_AMBULATORY_CARE_PROVIDER_SITE_OTHER): Payer: 59 | Admitting: Gastroenterology

## 2020-12-02 VITALS — BP 143/91 | HR 105 | Ht <= 58 in | Wt 199.0 lb

## 2020-12-02 DIAGNOSIS — D649 Anemia, unspecified: Secondary | ICD-10-CM | POA: Diagnosis not present

## 2020-12-02 DIAGNOSIS — Z86010 Personal history of colonic polyps: Secondary | ICD-10-CM

## 2020-12-02 MED ORDER — PEG 3350-KCL-NABCB-NACL-NASULF 236 G PO SOLR: ORAL | 0 refills | Status: DC

## 2020-12-02 NOTE — Progress Notes (Signed)
[name] MD, MRCP(U.K) [address]  [place]  [place], [place] [address]  Main: [phone]  Fax: [phone]   Gastroenterology Consultation  Referring Provider:     Dr Rosario Jacks Primary Care Physician:  Patient, No Pcp Per Primary Gastroenterologist:  Dr. [name]  Reason for Consultation:     Anemia         HPI:   Barbara Pierce is a 50 y.o. y/o female referred for  Evaluation of anemia.    10/02/2020: Hb 11.2 grams , Cr 1.20 . No iron studies available. Hb 10.5 grams in 01/2020  In 2018 seen her for blood in her stools, family history of colon polyps I performed a colonoscopy on 01/19/17 . She had multiple polyps of the colon ranging from 10 mm to 20 mm which were resected and pathology returned as a tubular adenoma with no dysplasia. At the 25 cm mark she had an abnormal appearing segment which appeared to be a submucosal lesion- I tatooed the area but did not take any biopsies to avoid scar tissue.  07/09/2017- A 15 mm polypoid lesion was found in the sigmoid colon. The lesion was semi-pedunculated. No bleeding was present.  appeared like a true pedunculated polyp. Not resected as colon not prepped.  She denies any nasal bleeds, rectal bleeding, blood in the urine.  Denies any regular NSAID use.  She has had a history of very heavy.'s.  To the extent that she had to wear pull-ups as she was soaking her pads very significantly.  Was started on birth control a few months back and seems to helped significantly.  Denies any abdominal symptoms.  Past Medical History:  Diagnosis Date  . Anemia   . Colon polyps   . COPD (chronic obstructive pulmonary disease) ([place])   . Diabetes mellitus without complication ([place])   . GERD (gastroesophageal reflux disease)   . High cholesterol   . Hypertension   . Sleep apnea     Past Surgical History:  Procedure Laterality Date  . COLONOSCOPY WITH PROPOFOL N/A 01/19/2017   Procedure: COLONOSCOPY WITH PROPOFOL;  Surgeon: [name],  MD;  Location: [facility] ENDOSCOPY;  Service: Endoscopy;  Laterality: N/A;  . DILATION AND CURETTAGE OF UTERUS    . FLEXIBLE SIGMOIDOSCOPY N/A 07/08/2017   Procedure: FLEXIBLE SIGMOIDOSCOPY;  Surgeon: [name], MD;  Location: [facility] ENDOSCOPY;  Service: Gastroenterology;  Laterality: N/A;  . surgery right forearm      Prior to Admission medications   Medication Sig Start Date End Date Taking? Authorizing Provider  albuterol (PROVENTIL HFA;VENTOLIN HFA) 108 (90 Base) MCG/ACT inhaler Inhale into the lungs. 10/01/16   [provider]  atorvastatin (LIPITOR) 20 MG tablet  12/07/16   [provider]  ferrous sulfate 325 (65 FE) MG EC tablet Take 1 tablet (325 mg total) by mouth 3 (three) times daily. 06/02/17 06/02/18  Darel Hong, MD  glipiZIDE (GLUCOTROL XL) 5 MG 24 hr tablet Take 5 mg by mouth daily. 04/27/17   [provider]  JENCYCLA 0.35 MG tablet  05/04/17   [provider]  lisinopril (PRINIVIL,ZESTRIL) 20 MG tablet Take 40 mg by mouth daily. 01/28/17   [provider]  metFORMIN (GLUCOPHAGE) 500 MG tablet  12/07/16   [provider]  Multiple Vitamins-Minerals (EMERGEN-C IMMUNE PO) Take by mouth.    [provider]  ONE TOUCH ULTRA TEST test strip  12/08/17   [provider]  Jonetta Speak LANCETS 19J MISC  12/08/17   [provider]  pantoprazole (PROTONIX) 40 MG tablet Take 40 mg by mouth daily. 04/27/17   [provider]    No family history on file.   Social History   Tobacco Use  . Smoking status: Former Smoker    Packs/day: 0.50    Types: Cigarettes    Quit date: 11/23/2017    Years since quitting: 3.0  . Smokeless tobacco: Never Used  Vaping Use  . Vaping Use: Never used  Substance Use Topics  . Alcohol use: Yes    Comment: occassional, less than once a week  . Drug use: No    Allergies as of 12/02/2020  . (No Known Allergies)    Review of Systems:    All systems reviewed and  negative except where noted in HPI.   Physical Exam:  BP (!) 143/91   Pulse (!) 105   Ht 4\' 9"  (1.448 m)   Wt 199 lb (90.3 kg)   BMI 43.06 kg/m  No LMP recorded. Psych:  Alert and cooperative. Normal mood and affect. General:   Alert,  Well-developed, well-nourished, pleasant and cooperative in NAD Head:  Normocephalic and atraumatic. Eyes:  Sclera clear, no icterus.   Conjunctiva pink. Ears:  Normal auditory acuity. Lungs:  Respirations even and unlabored.  Clear throughout to auscultation.   No wheezes, crackles, or rhonchi. No acute distress. Heart:  Regular rate and rhythm; no murmurs, clicks, rubs, or gallops. Abdomen:  Normal bowel sounds.  No bruits.  Soft, non-tender and non-distended without masses, hepatosplenomegaly or hernias noted.  No guarding or rebound tenderness.    Neurologic:  Alert and oriented x3;  grossly normal neurologically. Psych:  Alert and cooperative. Normal mood and affect.  Imaging Studies: No results found.  Assessment and Plan:   Barbara Pierce is a 50 y.o. y/o female has been referred for anemia . Normocytic . Last colonoscopy was in 2018, polyp in the sigmoid colon was seen which needed to be resected but she didn't return .  Based on her history the etiology of her anemia seems to be very likely secondary to menorrhagia which seems to have improved after commencing on birth control dose.  I will perform GI work-up to rule out other causes from my end   Plan  1. Anemia : Check iron studies B12,folate, H pylori  2. Colonoscopy to resect polyp  3. EGD at same time to evaluate anemia   I have discussed alternative options, risks & benefits,  which include, but are not limited to, bleeding, infection, perforation,respiratory complication & drug reaction.  The patient agrees with this plan & written consent will be obtained.     Follow up in 8 weeks   Dr [name] MD,MRCP(U.K)

## 2020-12-04 LAB — IRON,TIBC AND FERRITIN PANEL
Ferritin: 137 ng/mL (ref 15–150)
Iron Saturation: 25 % (ref 15–55)
Iron: 81 ug/dL (ref 27–159)
Total Iron Binding Capacity: 324 ug/dL (ref 250–450)
UIBC: 243 ug/dL (ref 131–425)

## 2020-12-04 LAB — URINALYSIS
Bilirubin, UA: NEGATIVE
Ketones, UA: NEGATIVE
Leukocytes,UA: NEGATIVE
Nitrite, UA: NEGATIVE
RBC, UA: NEGATIVE
Specific Gravity, UA: 1.02 (ref 1.005–1.030)
Urobilinogen, Ur: 0.2 mg/dL (ref 0.2–1.0)
pH, UA: 6 (ref 5.0–7.5)

## 2020-12-04 LAB — CELIAC DISEASE AB SCREEN W/RFX
Antigliadin Abs, IgA: 10 units (ref 0–19)
IgA/Immunoglobulin A, Serum: 257 mg/dL (ref 87–352)
Transglutaminase IgA: <2 U/mL (ref 0–3)

## 2020-12-04 LAB — B12 AND FOLATE PANEL
Folate: 2.8 ng/mL — ABNORMAL LOW (ref >3.0)
Vitamin B-12: 459 pg/mL (ref 232–1245)

## 2020-12-04 LAB — H. PYLORI BREATH TEST: H pylori Breath Test: NEGATIVE

## 2020-12-05 ENCOUNTER — Telehealth: Payer: Self-pay

## 2020-12-05 NOTE — Telephone Encounter (Signed)
Pt has been notified of results and Dr. Anna's recommendations. 

## 2020-12-05 NOTE — Telephone Encounter (Signed)
-----   Message from [name], MD sent at 12/04/2020  9:52 AM EDT ----- Inform that she has a low folic acid level.  Suggest to take a multivitamin with folic acid supplement and it.  H. pylori breath test is negative.  Iron studies are normal.

## 2020-12-12 ENCOUNTER — Other Ambulatory Visit: Payer: Self-pay

## 2020-12-12 ENCOUNTER — Other Ambulatory Visit
Admission: RE | Admit: 2020-12-12 | Discharge: 2020-12-12 | Disposition: A | Discharge status: Home / Self Care | Payer: 59 | Source: Ambulatory Visit | Attending: Gastroenterology | Admitting: Gastroenterology

## 2020-12-12 DIAGNOSIS — Z20822 Contact with and (suspected) exposure to covid-19: Secondary | ICD-10-CM | POA: Diagnosis not present

## 2020-12-12 DIAGNOSIS — Z01812 Encounter for preprocedural laboratory examination: Secondary | ICD-10-CM | POA: Insufficient documentation

## 2020-12-12 LAB — SARS CORONAVIRUS 2 (TAT 6-24 HRS): SARS Coronavirus 2: NEGATIVE

## 2020-12-16 ENCOUNTER — Ambulatory Visit: Payer: 59 | Admitting: Anesthesiology

## 2020-12-16 ENCOUNTER — Ambulatory Visit: Admission: RE | Admit: 2020-12-16 | Payer: 59 | Source: Home / Self Care | Admitting: Gastroenterology

## 2020-12-16 ENCOUNTER — Other Ambulatory Visit: Payer: Self-pay

## 2020-12-16 ENCOUNTER — Ambulatory Visit
Admission: RE | Admit: 2020-12-16 | Discharge: 2020-12-16 | Disposition: A | Discharge status: Home / Self Care | Payer: 59 | Source: Ambulatory Visit | Attending: Gastroenterology | Admitting: Gastroenterology

## 2020-12-16 ENCOUNTER — Encounter: Payer: Self-pay | Admitting: Gastroenterology

## 2020-12-16 ENCOUNTER — Encounter: Admission: RE | Payer: Self-pay | Source: Home / Self Care

## 2020-12-16 ENCOUNTER — Encounter
Admission: RE | Disposition: A | Discharge status: Home / Self Care | Payer: Self-pay | Source: Ambulatory Visit | Attending: Gastroenterology

## 2020-12-16 DIAGNOSIS — Z86010 Personal history of colonic polyps: Secondary | ICD-10-CM

## 2020-12-16 DIAGNOSIS — Z87891 Personal history of nicotine dependence: Secondary | ICD-10-CM | POA: Diagnosis not present

## 2020-12-16 DIAGNOSIS — D509 Iron deficiency anemia, unspecified: Secondary | ICD-10-CM | POA: Diagnosis present

## 2020-12-16 DIAGNOSIS — Z7984 Long term (current) use of oral hypoglycemic drugs: Secondary | ICD-10-CM | POA: Diagnosis not present

## 2020-12-16 DIAGNOSIS — D125 Benign neoplasm of sigmoid colon: Secondary | ICD-10-CM | POA: Diagnosis not present

## 2020-12-16 DIAGNOSIS — E119 Type 2 diabetes mellitus without complications: Secondary | ICD-10-CM | POA: Diagnosis not present

## 2020-12-16 DIAGNOSIS — K317 Polyp of stomach and duodenum: Secondary | ICD-10-CM | POA: Insufficient documentation

## 2020-12-16 DIAGNOSIS — K2980 Duodenitis without bleeding: Secondary | ICD-10-CM | POA: Diagnosis not present

## 2020-12-16 DIAGNOSIS — K635 Polyp of colon: Secondary | ICD-10-CM

## 2020-12-16 DIAGNOSIS — K621 Rectal polyp: Secondary | ICD-10-CM | POA: Diagnosis not present

## 2020-12-16 DIAGNOSIS — K219 Gastro-esophageal reflux disease without esophagitis: Secondary | ICD-10-CM | POA: Diagnosis not present

## 2020-12-16 DIAGNOSIS — Z7951 Long term (current) use of inhaled steroids: Secondary | ICD-10-CM | POA: Insufficient documentation

## 2020-12-16 DIAGNOSIS — I10 Essential (primary) hypertension: Secondary | ICD-10-CM | POA: Diagnosis not present

## 2020-12-16 DIAGNOSIS — D649 Anemia, unspecified: Secondary | ICD-10-CM

## 2020-12-16 HISTORY — PX: ESOPHAGOGASTRODUODENOSCOPY: SHX5428

## 2020-12-16 HISTORY — PX: COLONOSCOPY: SHX5424

## 2020-12-16 LAB — GLUCOSE, CAPILLARY: Glucose-Capillary: 148 mg/dL — ABNORMAL HIGH (ref 70–99)

## 2020-12-16 LAB — POCT PREGNANCY, URINE: Preg Test, Ur: NEGATIVE

## 2020-12-16 SURGERY — EGD (ESOPHAGOGASTRODUODENOSCOPY)
Anesthesia: General

## 2020-12-16 SURGERY — COLONOSCOPY WITH PROPOFOL
Anesthesia: General

## 2020-12-16 MED ORDER — PROPOFOL 500 MG/50ML IV EMUL
INTRAVENOUS | Status: DC | PRN
  Administered 2020-12-16: 120 ug/kg/min via INTRAVENOUS

## 2020-12-16 MED ORDER — SODIUM CHLORIDE 0.9 % IV SOLN: INTRAVENOUS | Status: DC

## 2020-12-16 MED ORDER — PROPOFOL 10 MG/ML IV BOLUS
INTRAVENOUS | Status: DC | PRN
  Administered 2020-12-16: 80 mg via INTRAVENOUS

## 2020-12-16 MED ORDER — LIDOCAINE HCL (CARDIAC) PF 100 MG/5ML IV SOSY
PREFILLED_SYRINGE | INTRAVENOUS | Status: DC | PRN
  Administered 2020-12-16: 50 mg via INTRAVENOUS

## 2020-12-16 NOTE — Op Note (Signed)
[facility] Gastroenterology Patient Name: Barbara Pierce Procedure Date: 12/16/2020 9:50 AM MRN: [id number] Account #: [id number] Date of Birth: 01-09-1971 Admit Type: Outpatient Age: 50 Room: [facility] ENDO ROOM 2 Gender: Female Note Status: Finalized Procedure:             Colonoscopy Indications:           Iron deficiency anemia Providers:             [name] MD, MD Referring MD:          No Local Md, MD (Referring MD) Medicines:             Monitored Anesthesia Care Complications:         No immediate complications. Procedure:             Pre-Anesthesia Assessment:                        - Prior to the procedure, a History and Physical was                         performed, and patient medications, allergies and                         sensitivities were reviewed. The patient's tolerance                         of previous anesthesia was reviewed.                        - The risks and benefits of the procedure and the                         sedation options and risks were discussed with the                         patient. All questions were answered and informed                         consent was obtained.                        - ASA Grade Assessment: II - A patient with mild                         systemic disease.                        After obtaining informed consent, the colonoscope was                         passed under direct vision. Throughout the procedure,                         the patient's blood pressure, pulse, and oxygen                         saturations were monitored continuously. The                         Colonoscope was introduced through the anus and  advanced to the the cecum, identified by the                         appendiceal orifice. The colonoscopy was performed                         with ease. The patient tolerated the procedure well.                         The quality of the bowel preparation  was good. Findings:      The perianal and digital rectal examinations were normal.      A 10 mm polyp was found in the sigmoid colon. The polyp was       semi-pedunculated. The polyp was removed with a saline injection-lift       technique using a hot snare. Resection and retrieval were complete. To       prevent bleeding post-maneuver, one hemostatic clip was successfully       placed. There was no bleeding at the end of the procedure.      A 12 mm polyp was found in the rectum. The polyp was sessile. The polyp       was removed with a saline injection-lift technique using a hot snare.       Resection and retrieval were complete.      The exam was otherwise without abnormality on direct and retroflexion       views. Impression:            - One 10 mm polyp in the sigmoid colon, removed using                         injection-lift and a hot snare. Resected and                         retrieved. Clip was placed.                        - One 12 mm polyp in the rectum, removed using                         injection-lift and a hot snare. Resected and retrieved.                        - The examination was otherwise normal on direct and                         retroflexion views. Recommendation:        - Discharge patient to home (with escort).                        - Resume previous diet.                        - Continue present medications.                        - Await pathology results.                        - Repeat colonoscopy for surveillance based on  pathology results. Procedure Code(s):     --- Professional ---                        [phone], Colonoscopy, flexible; with removal of                         tumor(s), polyp(s), or other lesion(s) by snare                         technique                        [id number], Colonoscopy, flexible; with directed submucosal                         injection(s), any substance Diagnosis Code(s):     --- Professional  ---                        K63.5, Polyp of colon                        K62.1, Rectal polyp                        D50.9, Iron deficiency anemia, unspecified CPT copyright 2019 [employer] Association. All rights reserved. The codes documented in this report are preliminary and upon coder review may  be revised to meet current compliance requirements. [name], MD [name] MD, MD 12/16/2020 10:21:32 AM This report has been signed electronically. Number of Addenda: 0 Note Initiated On: 12/16/2020 9:50 AM Scope Withdrawal Time: 0 hours 14 minutes 10 seconds  Total Procedure Duration: 0 hours 16 minutes 37 seconds  Estimated Blood Loss:  Estimated blood loss: none.      [facility]

## 2020-12-16 NOTE — H&P (Signed)
[name], MD [address] [place], [place], [place], [address] [address], [place], [place], [place], [address] Phone: [phone]  Fax: [phone]  Primary Care Physician:  Patient, No Pcp Per   Pre-Procedure History & Physical: HPI:  Barbara Pierce is a 50 y.o. female is here for an endoscopy and colonoscopy    Past Medical History:  Diagnosis Date  . Anemia   . Colon polyps   . COPD (chronic obstructive pulmonary disease) ([place])   . Diabetes mellitus without complication ([place])   . GERD (gastroesophageal reflux disease)   . High cholesterol   . Hypertension   . Sleep apnea     Past Surgical History:  Procedure Laterality Date  . COLONOSCOPY WITH PROPOFOL N/A 01/19/2017   Procedure: COLONOSCOPY WITH PROPOFOL;  Surgeon: [name], MD;  Location: [facility] ENDOSCOPY;  Service: Endoscopy;  Laterality: N/A;  . DILATION AND CURETTAGE OF UTERUS    . FLEXIBLE SIGMOIDOSCOPY N/A 07/08/2017   Procedure: FLEXIBLE SIGMOIDOSCOPY;  Surgeon: [name], MD;  Location: [facility] ENDOSCOPY;  Service: Gastroenterology;  Laterality: N/A;  . surgery right forearm      Prior to Admission medications   Medication Sig Start Date End Date Taking? Authorizing Provider  albuterol (PROVENTIL HFA;VENTOLIN HFA) 108 (90 Base) MCG/ACT inhaler Inhale into the lungs. 10/01/16   [provider]  atorvastatin (LIPITOR) 20 MG tablet  12/07/16   [provider]  ferrous sulfate 325 (65 FE) MG EC tablet Take 1 tablet (325 mg total) by mouth 3 (three) times daily. 06/02/17 06/02/18  Darel Hong, MD  glipiZIDE (GLUCOTROL XL) 5 MG 24 hr tablet Take 5 mg by mouth daily. 04/27/17   [provider]  JENCYCLA 0.35 MG tablet  05/04/17   [provider]  lisinopril (PRINIVIL,ZESTRIL) 20 MG tablet Take 40 mg by mouth daily. 01/28/17   [provider]  metFORMIN (GLUCOPHAGE) 500 MG tablet  12/07/16   [provider]  Multiple Vitamins-Minerals (EMERGEN-C IMMUNE PO)  Take by mouth.    [provider]  ONE TOUCH ULTRA TEST test strip  12/08/17   [provider]  ONETOUCH DELICA LANCETS 06Y MISC  12/08/17   [provider]  pantoprazole (PROTONIX) 40 MG tablet Take 40 mg by mouth daily. 04/27/17   [provider]  polyethylene glycol (GOLYTELY) 236 g solution Drink 8 oz every 20-30 minutes until entire prep is finished 12/02/20   [name], MD    Allergies as of 12/14/2020  . (No Known Allergies)    No family history on file.  Social History   Socioeconomic History  . Marital status: Married    Spouse name: Not on file  . Number of children: Not on file  . Years of education: Not on file  . Highest education level: Not on file  Occupational History  . Not on file  Tobacco Use  . Smoking status: Former Smoker    Packs/day: 0.50    Types: Cigarettes    Quit date: 11/23/2017    Years since quitting: 3.0  . Smokeless tobacco: Never Used  Vaping Use  . Vaping Use: Never used  Substance and Sexual Activity  . Alcohol use: Yes    Comment: occassional, less than once a week  . Drug use: No  . Sexual activity: Not on file  Other Topics Concern  . Not on file  Social History Narrative  . Not on file   Social Determinants of Health   Financial  Resource Strain: Not on file  Food Insecurity: Not on file  Transportation Needs: Not on file  Physical Activity: Not on file  Stress: Not on file  Social Connections: Not on file  Intimate Partner Violence: Not on file    Review of Systems: See HPI, otherwise negative ROS  Physical Exam: There were no vitals taken for this visit. General:   Alert,  pleasant and cooperative in NAD Head:  Normocephalic and atraumatic. Neck:  Supple; no masses or thyromegaly. Lungs:  Clear throughout to auscultation, normal respiratory effort.    Heart:  +S1, +S2, Regular rate and rhythm, No edema. Abdomen:  Soft, nontender and nondistended. Normal bowel sounds, without  guarding, and without rebound.   Neurologic:  Alert and  oriented x4;  grossly normal neurologically.  Impression/Plan: Barbara Pierce is here for an endoscopy and colonoscopy  to be performed for  evaluation of anemia    Risks, benefits, limitations, and alternatives regarding endoscopy have been reviewed with the patient.  Questions have been answered.  All parties agreeable.   [name], MD  12/16/2020, 8:33 AM

## 2020-12-16 NOTE — Transfer of Care (Signed)
Immediate Anesthesia Transfer of Care Note  Patient: Donice Alperin  Procedure(s) Performed: ESOPHAGOGASTRODUODENOSCOPY (EGD) (N/A ) COLONOSCOPY (N/A )  Patient Location: PACU and Endoscopy Unit  Anesthesia Type:General  Level of Consciousness: drowsy and patient cooperative  Airway & Oxygen Therapy: Patient Spontanous Breathing  Post-op Assessment: Report given to RN and Post -op Vital signs reviewed and stable  Post vital signs: Reviewed and stable  Last Vitals:  Vitals Value Taken Time  BP 96/66 12/16/20 1024  Temp    Pulse 95 12/16/20 1024  Resp 20 12/16/20 1024  SpO2 95 % 12/16/20 1024  Vitals shown include unvalidated device data.  Last Pain:  Vitals:   12/16/20 1020  TempSrc: Temporal  PainSc:          Complications: No complications documented.

## 2020-12-16 NOTE — Anesthesia Preprocedure Evaluation (Signed)
Anesthesia Evaluation  Patient identified by MRN, date of birth, ID band Patient awake    Reviewed: Allergy & Precautions, NPO status , Patient's Chart, lab work & pertinent test results  History of Anesthesia Complications Negative for: history of anesthetic complications  Airway Mallampati: III       Dental  (+) Chipped, Loose   Pulmonary sleep apnea and Continuous Positive Airway Pressure Ventilation , COPD, former smoker,           Cardiovascular hypertension, Pt. on medications (-) Past MI and (-) CHF (-) dysrhythmias (-) Valvular Problems/Murmurs     Neuro/Psych neg Seizures    GI/Hepatic Neg liver ROS, GERD  Medicated and Controlled,  Endo/Other  diabetes, Type 2, Oral Hypoglycemic Agents  Renal/GU Renal InsufficiencyRenal disease     Musculoskeletal   Abdominal   Peds  Hematology   Anesthesia Other Findings   Reproductive/Obstetrics                             Anesthesia Physical Anesthesia Plan  ASA: III  Anesthesia Plan: General   Post-op Pain Management:    Induction:   PONV Risk Score and Plan: 3 and Propofol infusion and TIVA  Airway Management Planned: Nasal Cannula  Additional Equipment:   Intra-op Plan:   Post-operative Plan:   Informed Consent: I have reviewed the patients History and Physical, chart, labs and discussed the procedure including the risks, benefits and alternatives for the proposed anesthesia with the patient or authorized representative who has indicated his/her understanding and acceptance.       Plan Discussed with:   Anesthesia Plan Comments:         Anesthesia Quick Evaluation

## 2020-12-16 NOTE — Op Note (Signed)
[facility] Patient Name: Barbara Pierce Procedure Date: 12/16/2020 9:51 AM MRN: [id number] Account #: [id number] Date of Birth: 06-06-1971 Admit Type: Outpatient Age: 50 Room: [facility] ENDO ROOM 2 Gender: Female Note Status: Finalized Procedure:             Upper GI endoscopy Indications:           Iron deficiency anemia Providers:             [name] MD, MD Referring MD:          No Local Md, MD (Referring MD) Medicines:             Monitored Anesthesia Care Complications:         No immediate complications. Procedure:             Pre-Anesthesia Assessment:                        - Prior to the procedure, a History and Physical was                         performed, and patient medications, allergies and                         sensitivities were reviewed. The patient's tolerance                         of previous anesthesia was reviewed.                        - The risks and benefits of the procedure and the                         sedation options and risks were discussed with the                         patient. All questions were answered and informed                         consent was obtained.                        - ASA Grade Assessment: II - A patient with mild                         systemic disease.                        After obtaining informed consent, the endoscope was                         passed under direct vision. Throughout the procedure,                         the patient's blood pressure, pulse, and oxygen                         saturations were monitored continuously. The Endoscope                         was introduced through the mouth,  and advanced to the                         third part of duodenum. The upper GI endoscopy was                         accomplished with ease. The patient tolerated the                         procedure well. Findings:      The esophagus was normal.      The stomach was  normal.      The cardia and gastric fundus were normal on retroflexion.      A few 3 to 10 mm sessile polyps with no bleeding were found in the       duodenal bulb. Biopsies were taken with a cold forceps for histology.      The exam was otherwise without abnormality. Impression:            - Normal esophagus.                        - Normal stomach.                        - A few duodenal polyps. Biopsied.                        - The examination was otherwise normal. Recommendation:        - Await pathology results.                        - Perform a colonoscopy today. Procedure Code(s):     --- Professional ---                        [id number], Esophagogastroduodenoscopy, flexible,                         transoral; with biopsy, single or multiple Diagnosis Code(s):     --- Professional ---                        K31.7, Polyp of stomach and duodenum                        D50.9, Iron deficiency anemia, unspecified CPT copyright 2019 American Medical Association. All rights reserved. The codes documented in this report are preliminary and upon coder review may  be revised to meet current compliance requirements. [name], MD [name] MD, MD 12/16/2020 10:00:31 AM This report has been signed electronically. Number of Addenda: 0 Note Initiated On: 12/16/2020 9:51 AM Estimated Blood Loss:  Estimated blood loss: none.      [facility]

## 2020-12-17 ENCOUNTER — Encounter: Payer: Self-pay | Admitting: Gastroenterology

## 2020-12-17 LAB — SURGICAL PATHOLOGY

## 2020-12-17 NOTE — Anesthesia Postprocedure Evaluation (Signed)
Anesthesia Post Note  Patient: Barbara Pierce  Procedure(s) Performed: ESOPHAGOGASTRODUODENOSCOPY (EGD) (N/A ) COLONOSCOPY (N/A )  Anesthesia Type: General Anesthetic complications: no   No complications documented.   Last Vitals:  Vitals:   12/16/20 1040 12/16/20 1050  BP: 113/68 112/71  Pulse: 83 78  Resp: 19 (!) 25  Temp:    SpO2: 98% 98%    Last Pain:  Vitals:   12/16/20 1020  TempSrc: Temporal  PainSc:                  Molli Barrows

## 2021-01-10 ENCOUNTER — Other Ambulatory Visit: Payer: Self-pay

## 2021-01-14 ENCOUNTER — Encounter: Payer: Self-pay | Admitting: Gastroenterology

## 2021-02-03 ENCOUNTER — Ambulatory Visit (INDEPENDENT_AMBULATORY_CARE_PROVIDER_SITE_OTHER): Payer: Managed Care, Other (non HMO) | Admitting: Gastroenterology

## 2021-02-03 ENCOUNTER — Encounter: Payer: Self-pay | Admitting: Gastroenterology

## 2021-02-03 ENCOUNTER — Other Ambulatory Visit: Payer: Self-pay

## 2021-02-03 VITALS — BP 122/88 | HR 94 | Ht <= 58 in | Wt 192.4 lb

## 2021-02-03 DIAGNOSIS — E538 Deficiency of other specified B group vitamins: Secondary | ICD-10-CM | POA: Diagnosis not present

## 2021-02-03 DIAGNOSIS — D649 Anemia, unspecified: Secondary | ICD-10-CM

## 2021-02-03 NOTE — Progress Notes (Signed)
Barbara Bellows MD, MRCP(U.K) [address]  [place]  [place], [place] [address]  Main: [phone]  Fax: [phone]   Primary Care Physician: Patient, No Pcp Per (Inactive)  Primary Gastroenterologist:  Dr. Jonathon Pierce    She is here to follow-up on anemia   HPI: Barbara Pierce is a 50 y.o. female    Summary of history :  Was last seen in March 2022 for anemia.  Hemoglobin of 11.2 g with a creatinine of 1.2 in January 2022 with no iron studies available.  I performed a colonoscopy last in May 2018 for family history of colon polyps and blood in her stools.  Multiple polyps noted ranging from 10 mm to 20 mm which were resected and noted to have tubular adenoma with no dysplasia.  She had a polyp which was supposed to be resected but as she was not prepped in 2018 was supposed to be rescheduled.  At her last visit she complained of significant menorrhagia.  Interval history   **12/02/2020-02/03/2021  12/02/2020: Iron studies normal folic acid was low, H. pylori breath test negative B12 levels normal at advised to commence on folate supplementation  12/16/2020: EGD: Few sessile polyps were seen in the duodenal bulb biopsies were taken.,  Colonoscopy was also performed on the same day 10 mm polyp was seen in sigmoid colon resected with a hot snare with saline injection.  12 mm polyp in the rectum was resected with saline injection.  Pathology demonstrated peptic duodenitis from the duodenal polyp and features of hyperplastic polyp and tubular adenoma of the rectum respectively.  Doing well taking folic acid , no new complaints. On hormonal pills for menorrhagia   Current Outpatient Medications  Medication Sig Dispense Refill  . albuterol (PROVENTIL HFA;VENTOLIN HFA) 108 (90 Base) MCG/ACT inhaler Inhale into the lungs. (Patient not taking: Reported on 12/16/2020)    . atorvastatin (LIPITOR) 20 MG tablet     . ferrous sulfate 325 (65 FE) MG EC tablet Take 1 tablet (325 mg total) by  mouth 3 (three) times daily. 90 tablet 0  . glipiZIDE (GLUCOTROL XL) 5 MG 24 hr tablet Take 5 mg by mouth daily.  4  . JENCYCLA 0.35 MG tablet     . lisinopril (PRINIVIL,ZESTRIL) 20 MG tablet Take 40 mg by mouth daily.  6  . metFORMIN (GLUCOPHAGE) 500 MG tablet     . Multiple Vitamins-Minerals (EMERGEN-C IMMUNE PO) Take by mouth.    . ONE TOUCH ULTRA TEST test strip     . ONETOUCH DELICA LANCETS 88F MISC     . pantoprazole (PROTONIX) 40 MG tablet Take 40 mg by mouth daily.  6  . polyethylene glycol (GOLYTELY) 236 g solution Drink 8 oz every 20-30 minutes until entire prep is finished 4000 mL 0   No current facility-administered medications for this visit.    Allergies as of 02/03/2021  . (No Known Allergies)    ROS:  General: Negative for anorexia, weight loss, fever, chills, fatigue, weakness. ENT: Negative for hoarseness, difficulty swallowing , nasal congestion. CV: Negative for chest pain, angina, palpitations, dyspnea on exertion, peripheral edema.  Respiratory: Negative for dyspnea at rest, dyspnea on exertion, cough, sputum, wheezing.  GI: See history of present illness. GU:  Negative for dysuria, hematuria, urinary incontinence, urinary frequency, nocturnal urination.  Endo: Negative for unusual weight change.    Physical Examination:   There were no vitals taken for this visit.  General: Well-nourished, well-developed in no acute distress.  Eyes:  No icterus. Conjunctivae pink. Mouth: Oropharyngeal mucosa moist and pink , no lesions erythema or exudate. Lungs: Clear to auscultation bilaterally. Non-labored. Heart: Regular rate and rhythm, no murmurs rubs or gallops.  Abdomen: Bowel sounds are normal, nontender, nondistended, no hepatosplenomegaly or masses, no abdominal bruits or hernia , no rebound or guarding.   Extremities: No lower extremity edema. No clubbing or deformities. Neuro: Alert and oriented x 3.  Grossly intact. Skin: Warm and dry, no jaundice.   Psych:  Alert and cooperative, normal mood and affect.   Imaging Studies: No results found.  Assessment and Plan:   [name] is a 50 y.o. y/o female here to follow-up for anemia . Normocytic .  Iron studies showed no evidence of iron deficiency but folic acid deficiency was noted and she was commenced on folic acid supplementation.  EGD and colonoscopy showed no abnormalities which would explain the anemia.  Very likely menorrhagia has resolved to play.  Plan 1.  Check CBC and folate acid levels today.  If anemia persists despite folic acid levels returned normal I will send her to hematology for an opinion to rule out any bone marrow issues since she has no iron deficiency but yet is anemic.    Dr Barbara Bellows  MD,MRCP [facility]) Follow up as needed

## 2021-02-04 LAB — FOLATE: Folate: 5.9 ng/mL (ref >3.0)

## 2021-02-04 LAB — CBC WITH DIFFERENTIAL/PLATELET
Basophils Absolute: 0 10*3/uL (ref 0.0–0.2)
Basos: 0 %
EOS (ABSOLUTE): 0.2 10*3/uL (ref 0.0–0.4)
Eos: 1 %
Hematocrit: 34.6 % (ref 34.0–46.6)
Hemoglobin: 11.4 g/dL (ref 11.1–15.9)
Immature Grans (Abs): 0 10*3/uL (ref 0.0–0.1)
Immature Granulocytes: 0 %
Lymphocytes Absolute: 2.2 10*3/uL (ref 0.7–3.1)
Lymphs: 18 %
MCH: 30.6 pg (ref 26.6–33.0)
MCHC: 32.9 g/dL (ref 31.5–35.7)
MCV: 93 fL (ref 79–97)
Monocytes Absolute: 0.8 10*3/uL (ref 0.1–0.9)
Monocytes: 7 %
Neutrophils Absolute: 9.3 10*3/uL — ABNORMAL HIGH (ref 1.4–7.0)
Neutrophils: 74 %
Platelets: 242 10*3/uL (ref 150–450)
RBC: 3.72 x10E6/uL — ABNORMAL LOW (ref 3.77–5.28)
RDW: 13.1 % (ref 11.7–15.4)
WBC: 12.6 10*3/uL — ABNORMAL HIGH (ref 3.4–10.8)

## 2021-02-04 LAB — RETICULOCYTES: Retic Ct Pct: 3.4 % — ABNORMAL HIGH (ref 0.6–2.6)

## 2021-04-05 IMAGING — US US RENAL
1 series · 14 of 25 positions shown · non-contrast
Comparison: None

CLINICAL DATA: Stage III A chronic kidney disease, COPD, diabetes
mellitus, hypertension

EXAM:
RENAL / URINARY TRACT ULTRASOUND COMPLETE

[Series 1: us renal · 14 of 32 slices shown]
[im 1/32]
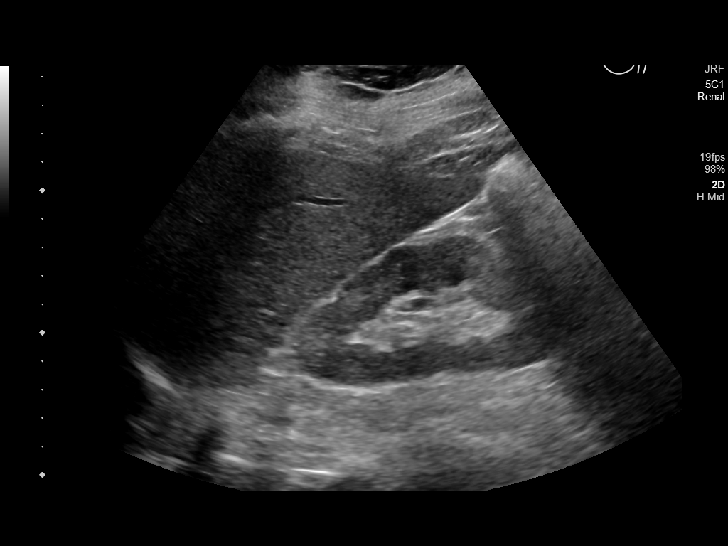
[im 3/32]
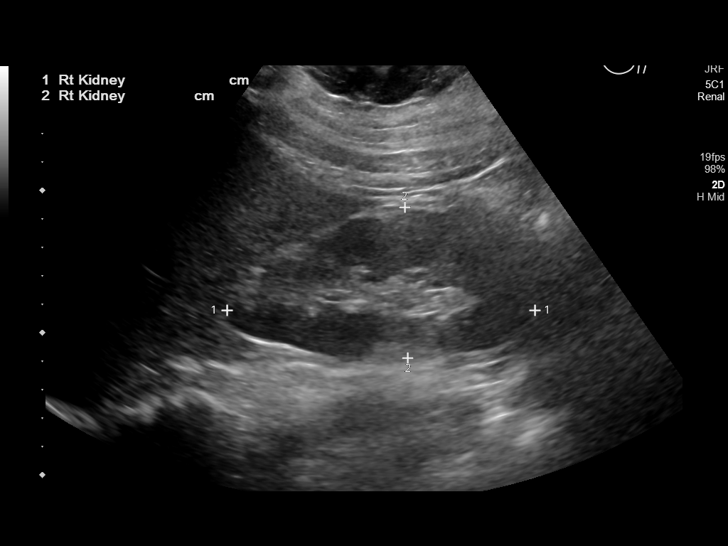
[im 6/32]
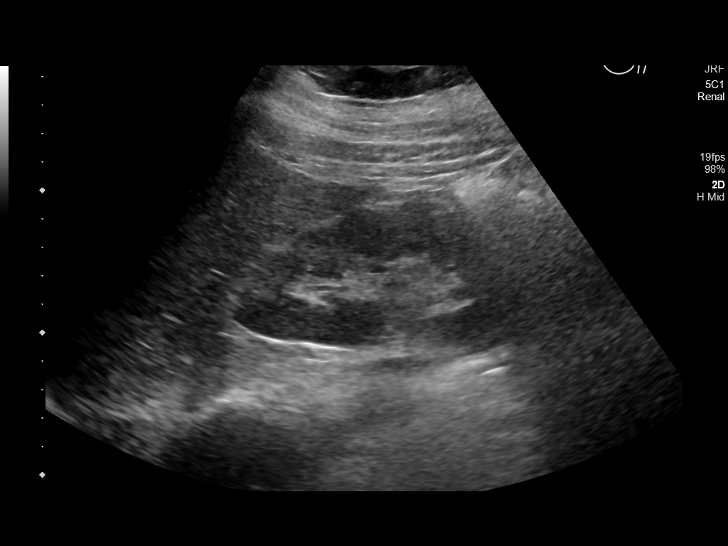
[im 8/32]
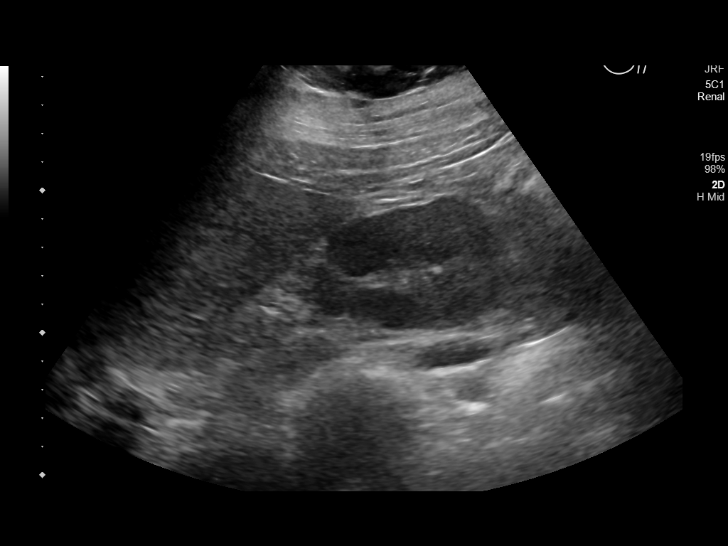
[im 11/32]
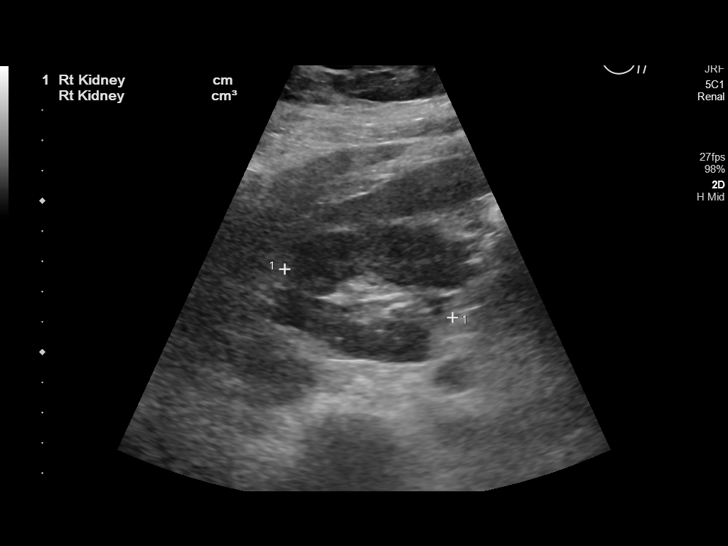
[im 12/32]
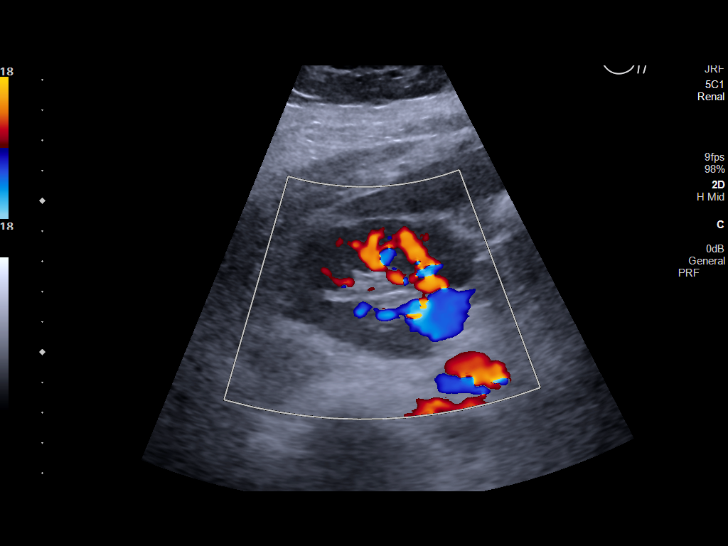
[im 15/32]
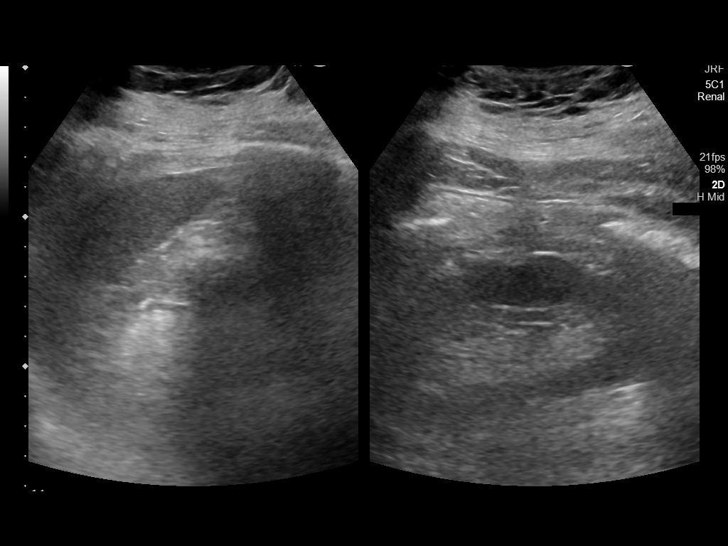
[im 17/32]
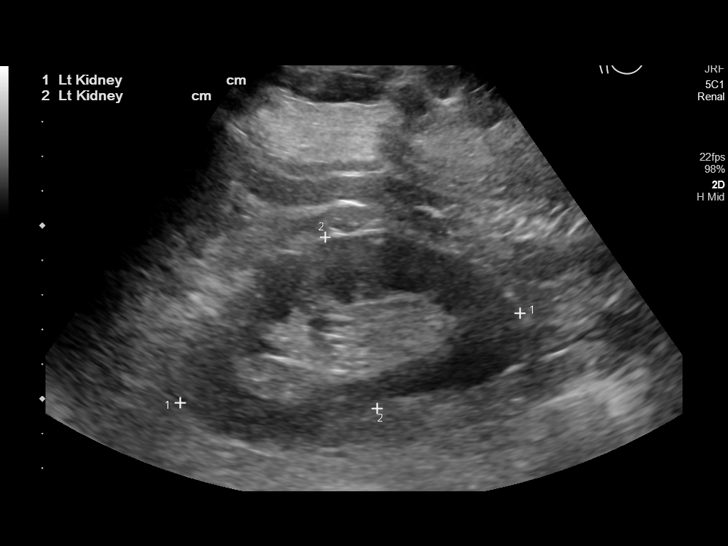
[im 20/32]
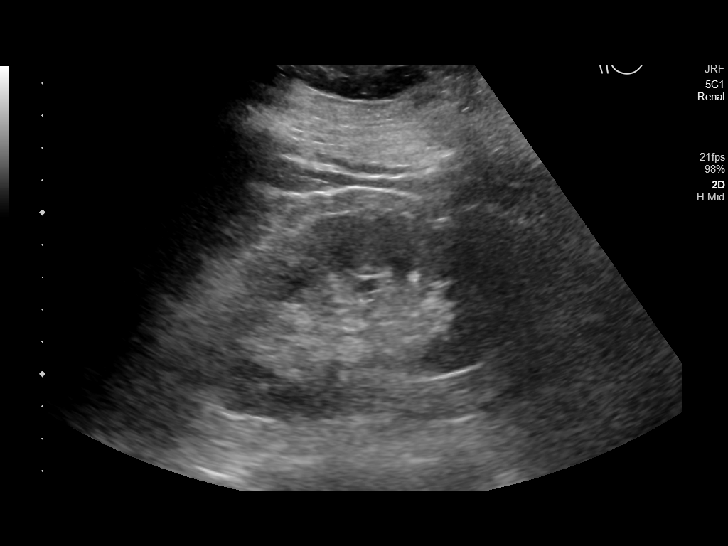
[im 21/32]
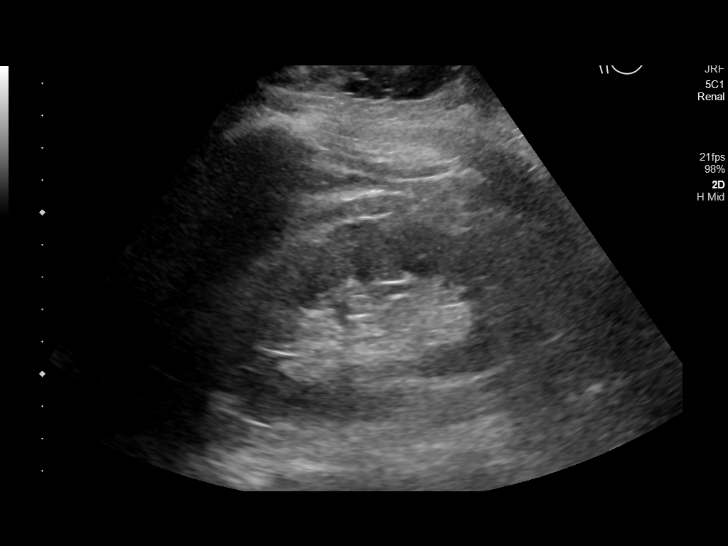
[im 24/32]
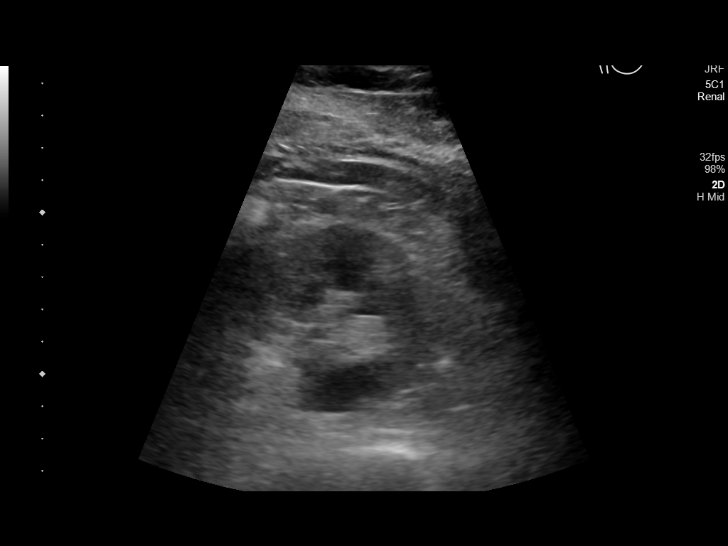
[im 26/32]
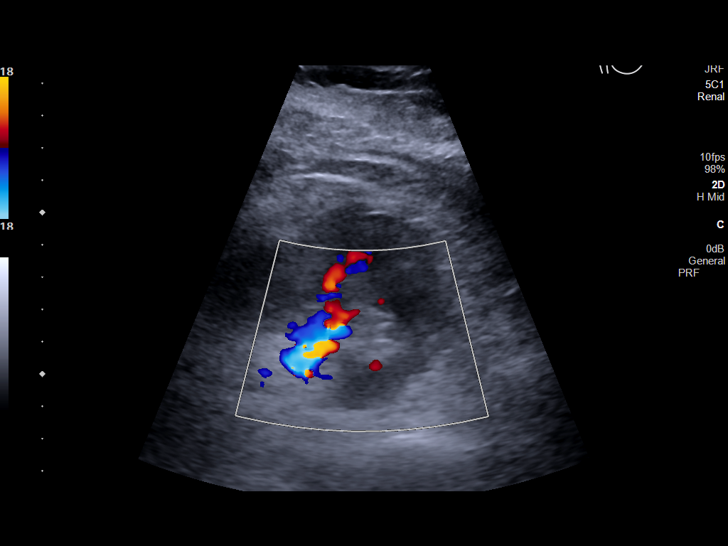
[im 29/32]
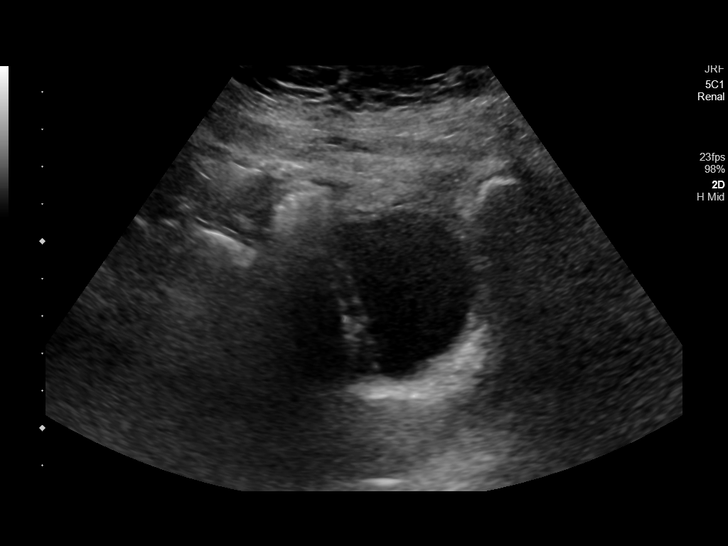
[im 32/32]
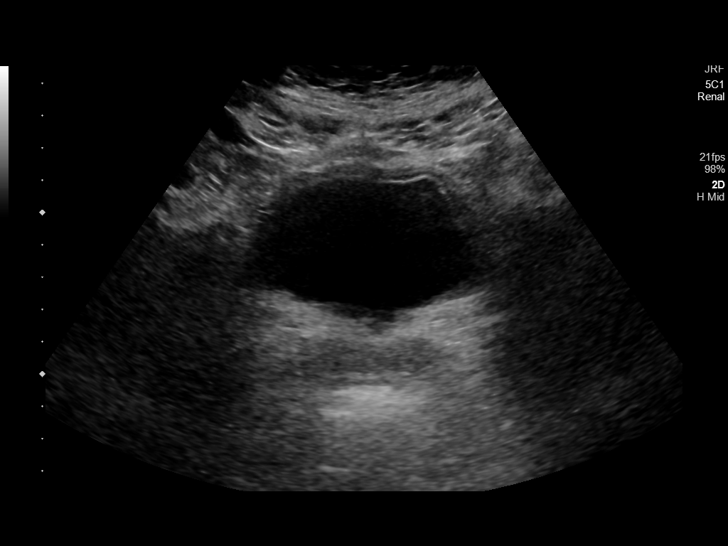

[14 of 25 positions shown; findings below may reference images not displayed]

FINDINGS: Right Kidney:

Renal measurements: 10.8 x 5.3 x 5.8 cm = volume: 173 mL. Normal
cortical thickness. Upper normal cortical echogenicity. No mass,
hydronephrosis, or shadowing calcification.

Left Kidney:

Renal measurements: 10.1 x 5.2 x 4.9 cm = volume: 133 mL. Normal
cortical thickness. Upper normal cortical echogenicity. No mass,
hydronephrosis, or shadowing calcification.

Bladder:

Appears normal for degree of bladder distention.

Other:

N/A
IMPRESSION: Normal exam.

## 2023-07-16 ENCOUNTER — Telehealth: Payer: Self-pay | Admitting: Gastroenterology

## 2023-07-16 NOTE — Telephone Encounter (Signed)
Crystal called in from patient PCP office to check with patient next colonoscopy. Per Dr. Tobi Bastos I recommend you have a repeat colonoscopy in 3 years to determine if you have developed any new polyps and to screen for colorectal cancer. Biopsies of your small intestine showed inflammation i.e. duodenitis.

## 2024-01-30 ENCOUNTER — Observation Stay
Admit: 2024-01-30 | Discharge: 2024-01-30 | Disposition: A | Discharge status: Home / Self Care | Payer: Self-pay | Attending: Internal Medicine

## 2024-01-30 ENCOUNTER — Emergency Department: Payer: Self-pay

## 2024-01-30 ENCOUNTER — Observation Stay
Admission: EM | Admit: 2024-01-30 | Discharge: 2024-01-30 | Disposition: A | Discharge status: Home / Self Care | Payer: Self-pay | Attending: Emergency Medicine | Admitting: Emergency Medicine

## 2024-01-30 ENCOUNTER — Other Ambulatory Visit: Payer: Self-pay

## 2024-01-30 DIAGNOSIS — J449 Chronic obstructive pulmonary disease, unspecified: Secondary | ICD-10-CM | POA: Insufficient documentation

## 2024-01-30 DIAGNOSIS — Z6841 Body Mass Index (BMI) 40.0 and over, adult: Secondary | ICD-10-CM | POA: Insufficient documentation

## 2024-01-30 DIAGNOSIS — Z79899 Other long term (current) drug therapy: Secondary | ICD-10-CM | POA: Insufficient documentation

## 2024-01-30 DIAGNOSIS — I10 Essential (primary) hypertension: Secondary | ICD-10-CM | POA: Insufficient documentation

## 2024-01-30 DIAGNOSIS — E66813 Obesity, class 3: Secondary | ICD-10-CM | POA: Insufficient documentation

## 2024-01-30 DIAGNOSIS — E1122 Type 2 diabetes mellitus with diabetic chronic kidney disease: Secondary | ICD-10-CM | POA: Insufficient documentation

## 2024-01-30 DIAGNOSIS — F17210 Nicotine dependence, cigarettes, uncomplicated: Secondary | ICD-10-CM | POA: Insufficient documentation

## 2024-01-30 DIAGNOSIS — R6510 Systemic inflammatory response syndrome (SIRS) of non-infectious origin without acute organ dysfunction: Secondary | ICD-10-CM | POA: Insufficient documentation

## 2024-01-30 DIAGNOSIS — G4733 Obstructive sleep apnea (adult) (pediatric): Secondary | ICD-10-CM | POA: Insufficient documentation

## 2024-01-30 DIAGNOSIS — R059 Cough, unspecified: Secondary | ICD-10-CM | POA: Insufficient documentation

## 2024-01-30 DIAGNOSIS — E1165 Type 2 diabetes mellitus with hyperglycemia: Secondary | ICD-10-CM

## 2024-01-30 DIAGNOSIS — N1831 Chronic kidney disease, stage 3a: Secondary | ICD-10-CM | POA: Insufficient documentation

## 2024-01-30 DIAGNOSIS — I959 Hypotension, unspecified: Secondary | ICD-10-CM | POA: Insufficient documentation

## 2024-01-30 DIAGNOSIS — Z794 Long term (current) use of insulin: Secondary | ICD-10-CM | POA: Insufficient documentation

## 2024-01-30 DIAGNOSIS — R55 Syncope and collapse: Principal | ICD-10-CM | POA: Insufficient documentation

## 2024-01-30 DIAGNOSIS — Z7984 Long term (current) use of oral hypoglycemic drugs: Secondary | ICD-10-CM | POA: Insufficient documentation

## 2024-01-30 DIAGNOSIS — G4730 Sleep apnea, unspecified: Secondary | ICD-10-CM | POA: Insufficient documentation

## 2024-01-30 DIAGNOSIS — I129 Hypertensive chronic kidney disease with stage 1 through stage 4 chronic kidney disease, or unspecified chronic kidney disease: Secondary | ICD-10-CM | POA: Insufficient documentation

## 2024-01-30 DIAGNOSIS — Z52008 Unspecified donor, other blood: Secondary | ICD-10-CM | POA: Insufficient documentation

## 2024-01-30 DIAGNOSIS — E119 Type 2 diabetes mellitus without complications: Secondary | ICD-10-CM | POA: Insufficient documentation

## 2024-01-30 LAB — CBC WITH DIFFERENTIAL/PLATELET
Abs Immature Granulocytes: 0.14 10*3/uL — ABNORMAL HIGH (ref 0.00–0.07)
Basophils Absolute: 0.1 10*3/uL (ref 0.0–0.1)
Basophils Relative: 1 %
Eosinophils Absolute: 0.2 10*3/uL (ref 0.0–0.5)
Eosinophils Relative: 1 %
HCT: 38.9 % (ref 36.0–46.0)
Hemoglobin: 12.8 g/dL (ref 12.0–15.0)
Immature Granulocytes: 1 %
Lymphocytes Relative: 39 %
Lymphs Abs: 6.4 10*3/uL — ABNORMAL HIGH (ref 0.7–4.0)
MCH: 31.1 pg (ref 26.0–34.0)
MCHC: 32.9 g/dL (ref 30.0–36.0)
MCV: 94.4 fL (ref 80.0–100.0)
Monocytes Absolute: 1 10*3/uL (ref 0.1–1.0)
Monocytes Relative: 6 %
Neutro Abs: 8.5 10*3/uL — ABNORMAL HIGH (ref 1.7–7.7)
Neutrophils Relative %: 52 %
Platelets: 289 10*3/uL (ref 150–400)
RBC: 4.12 MIL/uL (ref 3.87–5.11)
RDW: 13.7 % (ref 11.5–15.5)
Smear Review: NORMAL
WBC: 16.4 10*3/uL — ABNORMAL HIGH (ref 4.0–10.5)
nRBC: 0 % (ref 0.0–0.2)

## 2024-01-30 LAB — COMPREHENSIVE METABOLIC PANEL WITH GFR
ALT: 17 U/L (ref 0–44)
AST: 20 U/L (ref 15–41)
Albumin: 3.1 g/dL — ABNORMAL LOW (ref 3.5–5.0)
Alkaline Phosphatase: 66 U/L (ref 38–126)
Anion gap: 10 (ref 5–15)
BUN: 22 mg/dL — ABNORMAL HIGH (ref 6–20)
CO2: 21 mmol/L — ABNORMAL LOW (ref 22–32)
Calcium: 8 mg/dL — ABNORMAL LOW (ref 8.9–10.3)
Chloride: 103 mmol/L (ref 98–111)
Creatinine, Ser: 1.29 mg/dL — ABNORMAL HIGH (ref 0.44–1.00)
GFR, Estimated: 50 mL/min — ABNORMAL LOW (ref >60)
Glucose, Bld: 241 mg/dL — ABNORMAL HIGH (ref 70–99)
Potassium: 3.8 mmol/L (ref 3.5–5.1)
Sodium: 134 mmol/L — ABNORMAL LOW (ref 135–145)
Total Bilirubin: 0.6 mg/dL (ref 0.0–1.2)
Total Protein: 6.2 g/dL — ABNORMAL LOW (ref 6.5–8.1)

## 2024-01-30 LAB — URINALYSIS, W/ REFLEX TO CULTURE (INFECTION SUSPECTED)
Bilirubin Urine: NEGATIVE
Glucose, UA: >=500 mg/dL — AB
Hgb urine dipstick: NEGATIVE
Ketones, ur: NEGATIVE mg/dL
Leukocytes,Ua: NEGATIVE
Nitrite: NEGATIVE
Protein, ur: NEGATIVE mg/dL
Specific Gravity, Urine: 1.006 (ref 1.005–1.030)
pH: 6 (ref 5.0–8.0)

## 2024-01-30 LAB — CBG MONITORING, ED
Glucose-Capillary: 445 mg/dL — ABNORMAL HIGH (ref 70–99)
Glucose-Capillary: 402 mg/dL — ABNORMAL HIGH (ref 70–99)
Glucose-Capillary: 398 mg/dL — ABNORMAL HIGH (ref 70–99)

## 2024-01-30 LAB — D-DIMER, QUANTITATIVE: D-Dimer, Quant: <0.27 ug{FEU}/mL (ref 0.00–0.50)

## 2024-01-30 LAB — TROPONIN I (HIGH SENSITIVITY)
Troponin I (High Sensitivity): 3 ng/L (ref <18)
Troponin I (High Sensitivity): 4 ng/L (ref <18)

## 2024-01-30 LAB — ECHOCARDIOGRAM COMPLETE
Height: 57.5 in
S' Lateral: 2.7 cm
Weight: 2987.67 [oz_av]

## 2024-01-30 LAB — HCG, QUANTITATIVE, PREGNANCY: hCG, Beta Chain, Quant, S: 1 m[IU]/mL (ref <5)

## 2024-01-30 LAB — RESP PANEL BY RT-PCR (RSV, FLU A&B, COVID)  RVPGX2
Influenza A by PCR: NEGATIVE
Influenza B by PCR: NEGATIVE
Resp Syncytial Virus by PCR: NEGATIVE
SARS Coronavirus 2 by RT PCR: NEGATIVE

## 2024-01-30 LAB — PROTIME-INR
INR: 1 (ref 0.8–1.2)
Prothrombin Time: 13.9 s (ref 11.4–15.2)

## 2024-01-30 LAB — HIV ANTIBODY (ROUTINE TESTING W REFLEX): HIV Screen 4th Generation wRfx: NONREACTIVE

## 2024-01-30 LAB — PROCALCITONIN: Procalcitonin: <0.1 ng/mL

## 2024-01-30 LAB — APTT: aPTT: 25 s (ref 24–36)

## 2024-01-30 MED ORDER — SODIUM CHLORIDE 0.9 % IV BOLUS (SEPSIS)
1000.0000 mL | Freq: Once | INTRAVENOUS | Status: AC
  Administered 2024-01-30: 1000 mL via INTRAVENOUS

## 2024-01-30 MED ORDER — ACETAMINOPHEN 650 MG RE SUPP: 650.0000 mg | Freq: Four times a day (QID) | RECTAL | Status: DC | PRN

## 2024-01-30 MED ORDER — LACTATED RINGERS IV SOLN
150.0000 mL/h | INTRAVENOUS | Status: DC
  Administered 2024-01-30: 150 mL/h via INTRAVENOUS

## 2024-01-30 MED ORDER — INSULIN ASPART 100 UNIT/ML IJ SOLN
0.0000 [IU] | Freq: Three times a day (TID) | INTRAMUSCULAR | Status: DC
Start: 2024-01-30 — End: 2024-01-30

## 2024-01-30 MED ORDER — ENOXAPARIN SODIUM 40 MG/0.4ML IJ SOSY
40.0000 mg | PREFILLED_SYRINGE | INTRAMUSCULAR | Status: DC
  Administered 2024-01-30: 40 mg via SUBCUTANEOUS
  Filled 2024-01-30: qty 0.4

## 2024-01-30 MED ORDER — ACETAMINOPHEN 500 MG PO TABS
1000.0000 mg | ORAL_TABLET | Freq: Once | ORAL | Status: AC
  Administered 2024-01-30: 1000 mg via ORAL
  Filled 2024-01-30: qty 2

## 2024-01-30 MED ORDER — INSULIN ASPART 100 UNIT/ML IJ SOLN: 0.0000 [IU] | Freq: Every day | INTRAMUSCULAR | Status: DC

## 2024-01-30 MED ORDER — ACETAMINOPHEN 325 MG PO TABS: 650.0000 mg | ORAL_TABLET | Freq: Four times a day (QID) | ORAL | Status: DC | PRN

## 2024-01-30 MED ORDER — ONDANSETRON HCL 4 MG PO TABS: 4.0000 mg | ORAL_TABLET | Freq: Four times a day (QID) | ORAL | Status: DC | PRN

## 2024-01-30 MED ORDER — SODIUM CHLORIDE 0.9% FLUSH: 3.0000 mL | Freq: Two times a day (BID) | INTRAVENOUS | Status: DC

## 2024-01-30 MED ORDER — INSULIN ASPART 100 UNIT/ML IJ SOLN
0.0000 [IU] | Freq: Three times a day (TID) | INTRAMUSCULAR | Status: DC
  Administered 2024-01-30: 9 [IU] via SUBCUTANEOUS
  Filled 2024-01-30: qty 1

## 2024-01-30 MED ORDER — ONDANSETRON HCL 4 MG/2ML IJ SOLN: 4.0000 mg | Freq: Four times a day (QID) | INTRAMUSCULAR | Status: DC | PRN

## 2024-01-30 NOTE — Assessment & Plan Note (Signed)
Renal function appears to be at baseline 

## 2024-01-30 NOTE — Assessment & Plan Note (Signed)
 Possible sepsis  Criteria include  low-grade temp, tachycardia and tachypnea with hypotension leukocytosis Given history of plasma donation will get blood cultures Follow blood culture to evaluate for bacteremia

## 2024-01-30 NOTE — Assessment & Plan Note (Signed)
 Complicating factor to overall prognosis and care

## 2024-01-30 NOTE — Assessment & Plan Note (Signed)
Not acutely exacerbated.  Albuterol as needed 

## 2024-01-30 NOTE — ED Provider Notes (Signed)
 [facility] Provider Note    Event Date/Time   First MD Initiated Contact with Patient 01/30/24 0036     (approximate)   History   Loss of Consciousness   HPI  Barbara Pierce is a 53 y.o. female with history of hypertension, diabetes, hyperlipidemia, COPD, obesity, chronic kidney disease presents to the emergency department after a syncopal event.  She states that she felt lightheaded when she got up to go to the bathroom and passed out.  She is not sure if she hit her head.  She is not on any blood thinners.  She continues to feel very dizzy.  She reports after this episode she had diffuse chest tightness and felt very short of breath.  This has improved.  No vomiting.  Chronically has diarrhea from metformin use that is unchanged.  No bloody stools, melena.  No numbness, tingling or focal weakness.  Reports she has been eating and drinking well but does report that she donated plasma today.  States she has donated plasma before and this is never happened.  EMS reports patient initial blood pressure was 70/30.  She was given IV fluids in route.   History provided by patient, EMS.    Past Medical History:  Diagnosis Date   Anemia    Colon polyps    COPD (chronic obstructive pulmonary disease) (HCC)    Diabetes mellitus without complication (HCC)    GERD (gastroesophageal reflux disease)    High cholesterol    Hypertension    Sleep apnea     Past Surgical History:  Procedure Laterality Date   COLONOSCOPY N/A 12/16/2020   Procedure: COLONOSCOPY;  Surgeon: Luke Salaam, MD;  Location: [facility] ENDOSCOPY;  Service: Gastroenterology;  Laterality: N/A;   COLONOSCOPY WITH PROPOFOL  N/A 01/19/2017   Procedure: COLONOSCOPY WITH PROPOFOL ;  Surgeon: Luke Salaam, MD;  Location: [facility] ENDOSCOPY;  Service: Endoscopy;  Laterality: N/A;   DILATION AND CURETTAGE OF UTERUS     ESOPHAGOGASTRODUODENOSCOPY N/A 12/16/2020   Procedure: ESOPHAGOGASTRODUODENOSCOPY (EGD);  Surgeon:  Luke Salaam, MD;  Location: [facility] ENDOSCOPY;  Service: Gastroenterology;  Laterality: N/A;   FLEXIBLE SIGMOIDOSCOPY N/A 07/08/2017   Procedure: FLEXIBLE SIGMOIDOSCOPY;  Surgeon: Luke Salaam, MD;  Location: [facility] ENDOSCOPY;  Service: Gastroenterology;  Laterality: N/A;   surgery right forearm      MEDICATIONS:  Prior to Admission medications   Medication Sig Start Date End Date Taking? Authorizing Provider  albuterol (PROVENTIL HFA;VENTOLIN HFA) 108 (90 Base) MCG/ACT inhaler Inhale into the lungs. Patient not taking: No sig reported 10/01/16   [provider]  atorvastatin (LIPITOR) 20 MG tablet  12/07/16   [provider]  ferrous sulfate  325 (65 FE) MG EC tablet Take 1 tablet (325 mg total) by mouth 3 (three) times daily. 06/02/17 06/02/18  Allegra Arch, MD  glipiZIDE (GLUCOTROL XL) 5 MG 24 hr tablet Take 5 mg by mouth daily. 04/27/17   [provider]  JENCYCLA 0.35 MG tablet  05/04/17   [provider]  lisinopril (PRINIVIL,ZESTRIL) 20 MG tablet Take 40 mg by mouth daily. 01/28/17   [provider]  metFORMIN (GLUCOPHAGE) 500 MG tablet  12/07/16   [provider]  Multiple Vitamins-Minerals (EMERGEN-C IMMUNE PO) Take by mouth.    [provider]  ONE TOUCH ULTRA TEST test strip  12/08/17   [provider]  Raeford Bullion LANCETS 33G MISC  12/08/17   [provider]  pantoprazole (PROTONIX) 40 MG tablet Take 40 mg by mouth daily. 04/27/17  [provider]  polyethylene glycol (GOLYTELY) 236 g solution Drink 8 oz every 20-30 minutes until entire prep is finished 12/02/20   Luke Salaam, MD    Physical Exam   Triage Vital Signs: ED Triage Vitals [01/30/24 0036]  Encounter Vitals Group     BP      Systolic BP Percentile      Diastolic BP Percentile      Pulse Rate (!) 107     Resp (!) 28     Temp 99.4 F (37.4 C)     Temp Source Oral     SpO2      Weight      Height      Head Circumference      Peak  Flow      Pain Score      Pain Loc      Pain Education      Exclude from Growth Chart     Most recent vital signs: Vitals:   01/30/24 0530 01/30/24 0700  BP: 105/70 106/61  Pulse: (!) 105 (!) 104  Resp: 15 16  Temp:    SpO2: 94% 94%    CONSTITUTIONAL: Alert, responds appropriately to questions. Well-appearing; well-nourished HEAD: Normocephalic, atraumatic EYES: Conjunctivae clear, pupils appear equal, sclera nonicteric ENT: normal nose; moist mucous membranes NECK: Supple, normal ROM, no midline cervical spine tenderness, step-off or deformity CARD: Regular and tachycardic; S1 and S2 appreciated RESP: Normal chest excursion without splinting or tachypnea; breath sounds clear and equal bilaterally; no wheezes, no rhonchi, no rales, no hypoxia or respiratory distress, speaking full sentences ABD/GI: Non-distended; soft, non-tender, no rebound, no guarding, no peritoneal signs BACK: The back appears normal, no midline spinal tenderness, step-off or deformity EXT: Normal ROM in all joints; no deformity noted, no edema, no calf tenderness or calf swelling SKIN: Normal color for age and race; warm; no rash on exposed skin NEURO: Moves all extremities equally, normal speech PSYCH: The patient's mood and manner are appropriate.   ED Results / Procedures / Treatments   LABS: (all labs ordered are listed, but only abnormal results are displayed) Labs Reviewed  CBC WITH DIFFERENTIAL/PLATELET - Abnormal; Notable for the following components:      Result Value   WBC 16.4 (*)    Neutro Abs 8.5 (*)    Lymphs Abs 6.4 (*)    Abs Immature Granulocytes 0.14 (*)    All other components within normal limits  COMPREHENSIVE METABOLIC PANEL WITH GFR - Abnormal; Notable for the following components:   Sodium 134 (*)    CO2 21 (*)    Glucose, Bld 241 (*)    BUN 22 (*)    Creatinine, Ser 1.29 (*)    Calcium 8.0 (*)    Total Protein 6.2 (*)    Albumin 3.1 (*)    GFR, Estimated 50 (*)     All other components within normal limits  URINALYSIS, W/ REFLEX TO CULTURE (INFECTION SUSPECTED) - Abnormal; Notable for the following components:   Color, Urine STRAW (*)    APPearance CLEAR (*)    Glucose, UA >=500 (*)    Bacteria, UA RARE (*)    All other components within normal limits  CBG MONITORING, ED - Abnormal; Notable for the following components:   Glucose-Capillary 445 (*)    All other components within normal limits  RESP PANEL BY RT-PCR (RSV, FLU A&B, COVID)  RVPGX2  CULTURE, BLOOD (ROUTINE X 2)  CULTURE, BLOOD (ROUTINE X 2)  D-DIMER,  QUANTITATIVE  HCG, QUANTITATIVE, PREGNANCY  PROCALCITONIN  HEMOGLOBIN A1C  HIV ANTIBODY (ROUTINE TESTING W REFLEX)  PROTIME-INR  APTT  TROPONIN I (HIGH SENSITIVITY)  TROPONIN I (HIGH SENSITIVITY)     EKG:  EKG Interpretation Date/Time:  Sunday Jan 30 2024 00:37:47 EDT Ventricular Rate:  102 PR Interval:  171 QRS Duration:  75 QT Interval:  341 QTC Calculation: 445 R Axis:   -8  Text Interpretation: Sinus tachycardia Low voltage, precordial leads Abnormal R-wave progression, early transition Confirmed by Verneda Golder [phone]) on 01/30/2024 1:24:49 AM         RADIOLOGY: My personal review and interpretation of imaging: Chest x-ray clear.  I have personally reviewed all radiology reports.   DG Chest Portable 1 View Result Date: 01/30/2024 CLINICAL DATA:  CP, SOB, cough, syncope EXAM: PORTABLE CHEST 1 VIEW COMPARISON:  Chest x-ray 02/13/2020, chest x-ray 10/25/2015 FINDINGS: The heart and mediastinal contours are unchanged. Atherosclerotic plaque. Stable chronic 3 mm right upper lobe peripheral calcified nodule. No focal consolidation. No pulmonary edema. No pleural effusion. No pneumothorax. No acute osseous abnormality. IMPRESSION: 1. No active disease. 2.  Aortic Atherosclerosis (ICD10-I70.0). Electronically Signed   By: Morgane  Naveau M.D.   On: 01/30/2024 01:34     PROCEDURES:  Critical Care performed: Yes, see  critical care procedure note(s)   CRITICAL CARE Performed by: Starling Eck Ina Scrivens   Total critical care time: 30 minutes  Critical care time was exclusive of separately billable procedures and treating other patients.  Critical care was necessary to treat or prevent imminent or life-threatening deterioration.  Critical care was time spent personally by me on the following activities: development of treatment plan with patient and/or surrogate as well as nursing, discussions with consultants, evaluation of patient's response to treatment, examination of patient, obtaining history from patient or surrogate, ordering and performing treatments and interventions, ordering and review of laboratory studies, ordering and review of radiographic studies, pulse oximetry and re-evaluation of patient's condition.   Aaron Aas1-3 Lead EKG Interpretation  Performed by: Giovanni Bath, Clover Dao, DO Authorized by: Chanee Henrickson, Clover Dao, DO     Interpretation: abnormal     ECG rate:  104   ECG rate assessment: tachycardic     Rhythm: sinus tachycardia     Ectopy: none     Conduction: normal       IMPRESSION / MDM / ASSESSMENT AND PLAN / ED COURSE  I reviewed the triage vital signs and the nursing notes.    Patient here with syncopal event.  Did have chest pain, shortness of breath that have improved.  Hypotensive with EMS.  Blood pressure improved but still tachycardic.  The patient is on the cardiac monitor to evaluate for evidence of arrhythmia and/or significant heart rate changes.   DIFFERENTIAL DIAGNOSIS (includes but not limited to):   Hypotension secondary to hypovolemia from recent plasma donation, anemia, sepsis, ACS, arrhythmia, PE, orthostasis   Patient's presentation is most consistent with acute presentation with potential threat to life or bodily function.   PLAN: EKG nonischemic.  Will obtain labs, urine, chest x-ray.  No signs of traumatic injury on exam.  Will also obtain D-dimer given patient is  tachycardic here with chest pain, shortness of breath previously.   MEDICATIONS GIVEN IN ED: Medications  sodium chloride  flush (NS) 0.9 % injection 3 mL (has no administration in time range)  lactated ringers infusion (150 mL/hr Intravenous New Bag/Given 01/30/24 0616)  insulin aspart (novoLOG) injection 0-20 Units (has no administration in time range)  insulin aspart (novoLOG) injection 0-5 Units (has no administration in time range)  enoxaparin (LOVENOX) injection 40 mg (has no administration in time range)  acetaminophen  (TYLENOL ) tablet 650 mg (has no administration in time range)    Or  acetaminophen  (TYLENOL ) suppository 650 mg (has no administration in time range)  ondansetron (ZOFRAN) tablet 4 mg (has no administration in time range)    Or  ondansetron (ZOFRAN) injection 4 mg (has no administration in time range)  sodium chloride  0.9 % bolus 1,000 mL (0 mLs Intravenous Stopped 01/30/24 0251)  acetaminophen  (TYLENOL ) tablet 1,000 mg (1,000 mg Oral Given 01/30/24 0048)  sodium chloride  0.9 % bolus 1,000 mL (0 mLs Intravenous Stopped 01/30/24 0542)     ED COURSE: Patient's labs do show leukocytosis of 16,000 with left shift however this does appear to be a chronic issue for patient.  She is afebrile here x 2.  Her procalcitonin is negative.  She does report having some dry cough but has no other infectious symptoms to suggest sepsis currently.  She does have minimally elevated creatinine from baseline which is suggestive of hypovolemia.  She is getting IV fluids.  Troponin x 2 is negative.  D-dimer negative.  Chest x-ray reviewed and interpreted by myself and radiologist and is clear.  No pneumonia.  Urine also shows no sign of infection or ketones.  After getting IV fluids patient did have another episode of hypotension but this improved with additional fluids.  Given she does have multiple risk factors as well for ACS and arrhythmia, I have recommended that we observe her in the  hospital, continue to hydrate her and monitor for any developing signs of sepsis, obtain possible echocardiogram and keep her on the cardiac monitor.  She is comfortable with this plan.  Will discuss with the hospitalist for admission.   CONSULTS:  Consulted and discussed patient's case with hospitalist, Dr. Vallarie Gauze.  I have recommended admission and consulting physician agrees and will place admission orders.  Patient (and family if present) agree with this plan.   I reviewed all nursing notes, vitals, pertinent previous records.  All labs, EKGs, imaging ordered have been independently reviewed and interpreted by myself.    OUTSIDE RECORDS REVIEWED: Reviewed last nephrology note 06/10/2021.       FINAL CLINICAL IMPRESSION(S) / ED DIAGNOSES   Final diagnoses:  Syncope, unspecified syncope type     Rx / DC Orders   ED Discharge Orders     None        Note:  This document was prepared using Dragon voice recognition software and may include unintentional dictation errors.   Laisha Rau, Clover Dao, DO 01/30/24 [phone]

## 2024-01-30 NOTE — Assessment & Plan Note (Signed)
 Blood sugar 241 Sliding scale coverage Hold home glipizide and metformin

## 2024-01-30 NOTE — ED Triage Notes (Signed)
 Pt arrives via [facility], States that she was sitting on couch, "felt woozy" and got up to go to bathroom, passed out on the floor and husband woke her up.  Unsure if she hit her head, denies blood thinners.  Pt Aox4 NAD, states she continues to feel dizzy. Denies N/V, endorses diarrhea (states Metformin use) at baselineStates CP earlier today. Gave plasma earlier today.

## 2024-01-30 NOTE — H&P (Signed)
 History and Physical    Patient: Barbara Pierce BJY:[id number] DOB: [birth year] DOA: 01/30/2024 DOS: the patient was seen and examined on 01/30/2024 PCP: Annelle Kiel, MD  Patient coming from: Home  Chief Complaint:  Chief Complaint  Patient presents with   Loss of Consciousness    HPI: Barbara Pierce is a 53 y.o. female with medical history significant for DM, HTN, obesity, OSA, COPD, recently started plasma donation (2 weeks prior) most recently on the day of arrival, being admitted for syncopal event at home.  States she was sitting on the couch at home, got up to go to the bathroom, felt lightheaded then passed out.  She states earlier in the week she had an episode of vomiting and abdominal discomfort.  She had loose stool but this is her baseline and started since being on metformin.  Reports that she felt a bit lightheaded after her first plasma donation a couple weeks prior but following that she felt fine.  Following her plasma donation on the day of arrival she was feeling okay and had a busy day going to the nail shop and running errands.  Doses a cough but no fever or chills.  Uses weed but no other illicit substance.  After coming around, she had chest tightness and shortness of breath which have since improved.  She denies lower extremity pain or swelling. ED course and data review: Tachycardic and tachypneic to 107 and 28 respectively with low-grade temperature of 99.4 and BP 97/56 improving with fluid bolus to 115/76 by admission. Labs notable for the following: WBC 16,000 with procalcitonin <0.10 Respiratory viral panel and UA negative Hemoglobin normal at 12.8 Troponin normal and D-dimer negative Elevated blood sugar of 241 Creatinine 1.29-most recent 1.17 in 2022  EKG,Personally viewed and interpreted showing sinus tachycardia at 102 with no concerning ST-T wave changes  Chest x-ray nonacute  Patient treated with 2 L NS and given Tylenol   Admission requested for  syncope workup     Review of Systems: As mentioned in the history of present illness. All other systems reviewed and are negative.  Past Medical History:  Diagnosis Date   Anemia    Colon polyps    COPD (chronic obstructive pulmonary disease) (HCC)    Diabetes mellitus without complication (HCC)    GERD (gastroesophageal reflux disease)    High cholesterol    Hypertension    Sleep apnea    Past Surgical History:  Procedure Laterality Date   COLONOSCOPY N/A 12/16/2020   Procedure: COLONOSCOPY;  Surgeon: Luke Salaam, MD;  Location: [facility] ENDOSCOPY;  Service: Gastroenterology;  Laterality: N/A;   COLONOSCOPY WITH PROPOFOL  N/A 01/19/2017   Procedure: COLONOSCOPY WITH PROPOFOL ;  Surgeon: Luke Salaam, MD;  Location: [facility] ENDOSCOPY;  Service: Endoscopy;  Laterality: N/A;   DILATION AND CURETTAGE OF UTERUS     ESOPHAGOGASTRODUODENOSCOPY N/A 12/16/2020   Procedure: ESOPHAGOGASTRODUODENOSCOPY (EGD);  Surgeon: Luke Salaam, MD;  Location: [facility] ENDOSCOPY;  Service: Gastroenterology;  Laterality: N/A;   FLEXIBLE SIGMOIDOSCOPY N/A 07/08/2017   Procedure: FLEXIBLE SIGMOIDOSCOPY;  Surgeon: Luke Salaam, MD;  Location: [facility] ENDOSCOPY;  Service: Gastroenterology;  Laterality: N/A;   surgery right forearm     Social History:  reports that she has been smoking cigarettes. She started smoking about 36 years ago. She has a 30 pack-year smoking history. She has never used smokeless tobacco. She reports current alcohol use. She reports that she does not use drugs.  No Known Allergies  No family history on file.  Prior to Admission medications  Medication Sig Start Date End Date Taking? Authorizing Provider  albuterol (PROVENTIL HFA;VENTOLIN HFA) 108 (90 Base) MCG/ACT inhaler Inhale into the lungs. Patient not taking: No sig reported 10/01/16   [provider]  atorvastatin (LIPITOR) 20 MG tablet  12/07/16   [provider]  ferrous sulfate  325 (65 FE) MG EC tablet Take 1 tablet (325 mg  total) by mouth 3 (three) times daily. 06/02/17 06/02/18  Allegra Arch, MD  glipiZIDE (GLUCOTROL XL) 5 MG 24 hr tablet Take 5 mg by mouth daily. 04/27/17   [provider]  JENCYCLA 0.35 MG tablet  05/04/17   [provider]  lisinopril (PRINIVIL,ZESTRIL) 20 MG tablet Take 40 mg by mouth daily. 01/28/17   [provider]  metFORMIN (GLUCOPHAGE) 500 MG tablet  12/07/16   [provider]  Multiple Vitamins-Minerals (EMERGEN-C IMMUNE PO) Take by mouth.    [provider]  ONE TOUCH ULTRA TEST test strip  12/08/17   [provider]  Raeford Bullion LANCETS 33G MISC  12/08/17   [provider]  pantoprazole (PROTONIX) 40 MG tablet Take 40 mg by mouth daily. 04/27/17   [provider]  polyethylene glycol (GOLYTELY) 236 g solution Drink 8 oz every 20-30 minutes until entire prep is finished 12/02/20   Luke Salaam, MD    Physical Exam: Vitals:   01/30/24 0133 01/30/24 0230 01/30/24 0300 01/30/24 0329  BP:  (!) 97/56 115/76   Pulse:  (!) 106 (!) 103   Resp:  15 18   Temp:    98.5 F (36.9 C)  TempSrc:    Oral  SpO2: 96% 94% 95%   Weight:      Height:       Physical Exam Vitals and nursing note reviewed.  Constitutional:      General: She is not in acute distress. HENT:     Head: Normocephalic and atraumatic.  Cardiovascular:     Rate and Rhythm: Regular rhythm. Tachycardia present.     Heart sounds: Normal heart sounds.  Pulmonary:     Effort: Pulmonary effort is normal.     Breath sounds: Normal breath sounds.  Abdominal:     Palpations: Abdomen is soft.     Tenderness: There is no abdominal tenderness.  Neurological:     Mental Status: Mental status is at baseline.     Labs on Admission: I have personally reviewed following labs and imaging studies  CBC: Recent Labs  Lab 01/30/24 0038  WBC 16.4*  NEUTROABS 8.5*  HGB 12.8  HCT 38.9  MCV 94.4  PLT 289   Basic Metabolic Panel: Recent Labs  Lab  01/30/24 0038  NA 134*  K 3.8  CL 103  CO2 21*  GLUCOSE 241*  BUN 22*  CREATININE 1.29*  CALCIUM 8.0*   GFR: Estimated Creatinine Clearance: 46 mL/min (A) (by C-G formula based on SCr of 1.29 mg/dL (H)). Liver Function Tests: Recent Labs  Lab 01/30/24 0038  AST 20  ALT 17  ALKPHOS 66  BILITOT 0.6  PROT 6.2*  ALBUMIN 3.1*   No results for input(s): "LIPASE", "AMYLASE" in the last 168 hours. No results for input(s): "AMMONIA" in the last 168 hours. Coagulation Profile: No results for input(s): "INR", "PROTIME" in the last 168 hours. Cardiac Enzymes: No results for input(s): "CKTOTAL", "CKMB", "CKMBINDEX", "TROPONINI" in the last 168 hours. BNP (last 3 results) No results for input(s): "PROBNP" in the last 8760 hours. HbA1C: No results for input(s): "HGBA1C" in the last 72  hours. CBG: No results for input(s): "GLUCAP" in the last 168 hours. Lipid Profile: No results for input(s): "CHOL", "HDL", "LDLCALC", "TRIG", "CHOLHDL", "LDLDIRECT" in the last 72 hours. Thyroid Function Tests: No results for input(s): "TSH", "T4TOTAL", "FREET4", "T3FREE", "THYROIDAB" in the last 72 hours. Anemia Panel: No results for input(s): "VITAMINB12", "FOLATE", "FERRITIN", "TIBC", "IRON", "RETICCTPCT" in the last 72 hours. Urine analysis:    Component Value Date/Time   COLORURINE STRAW (A) 01/30/2024 0253   APPEARANCEUR CLEAR (A) 01/30/2024 0253   APPEARANCEUR Clear 12/02/2020 1418   LABSPEC 1.006 01/30/2024 0253   PHURINE 6.0 01/30/2024 0253   GLUCOSEU >=500 (A) 01/30/2024 0253   HGBUR NEGATIVE 01/30/2024 0253   BILIRUBINUR NEGATIVE 01/30/2024 0253   BILIRUBINUR Negative 12/02/2020 1418   KETONESUR NEGATIVE 01/30/2024 0253   PROTEINUR NEGATIVE 01/30/2024 0253   NITRITE NEGATIVE 01/30/2024 0253   LEUKOCYTESUR NEGATIVE 01/30/2024 0253    Radiological Exams on Admission: DG Chest Portable 1 View Result Date: 01/30/2024 CLINICAL DATA:  CP, SOB, cough, syncope EXAM: PORTABLE CHEST 1  VIEW COMPARISON:  Chest x-ray 02/13/2020, chest x-ray 10/25/2015 FINDINGS: The heart and mediastinal contours are unchanged. Atherosclerotic plaque. Stable chronic 3 mm right upper lobe peripheral calcified nodule. No focal consolidation. No pulmonary edema. No pleural effusion. No pneumothorax. No acute osseous abnormality. IMPRESSION: 1. No active disease. 2.  Aortic Atherosclerosis (ICD10-I70.0). Electronically Signed   By: Morgane  Naveau M.D.   On: 01/30/2024 01:34   Data Reviewed for HPI: Relevant notes from primary care and specialist visits, past discharge summaries as available in EHR, including Care Everywhere. Prior diagnostic testing as pertinent to current admission diagnoses Updated medications and problem lists for reconciliation ED course, including vitals, labs, imaging, treatment and response to treatment Triage notes, nursing and pharmacy notes and ED provider's notes Notable results as noted above in HPI      Assessment and Plan: * Syncope Hypotension (orthostatic versus sepsis related or upper) Plasma donor Possibly related to plasma donation and inadequate hydration-this was patient's fifth session Possible recent gastroenteritis.  Had an episode of vomiting a couple days prior and has baseline frequent soft BMs Syncope workup to include continuous cardiac monitoring, echocardiogram and carotid Doppler Hold home antihypertensives IV fluids as needed to support BP and keep MAP over 65 Neurologic checks   SIRS (systemic inflammatory response syndrome) (HCC) Possible sepsis  Criteria include  low-grade temp, tachycardia and tachypnea with hypotension leukocytosis Given history of plasma donation will get blood cultures Follow blood culture to evaluate for bacteremia  Uncontrolled type 2 diabetes mellitus with hyperglycemia, without long-term current use of insulin (HCC) Blood sugar 241 Sliding scale coverage Hold home glipizide and metformin  Chronic kidney  disease, stage 3a (HCC) Renal function appears to be at baseline  Obesity, Class III, BMI 40-49.9 (morbid obesity) Complicating factor to overall prognosis and care  Sleep apnea CPAP if desired  COPD (chronic obstructive pulmonary disease) (HCC) Not acutely exacerbated Albuterol as needed  Hypertension Holding antihypertensives     DVT prophylaxis: Lovenox  Consults: none  Advance Care Planning: fkull code  Family Communication: none  Disposition Plan: Back to previous home environment  Severity of Illness: The appropriate patient status for this patient is OBSERVATION. Observation status is judged to be reasonable and necessary in order to provide the required intensity of service to ensure the patient's safety. The patient's presenting symptoms, physical exam findings, and initial radiographic and laboratory data in the context of their medical condition is felt to place them at  decreased risk for further clinical deterioration. Furthermore, it is anticipated that the patient will be medically stable for discharge from the hospital within 2 midnights of admission.   Author: Lanetta Pion, MD 01/30/2024 4:03 AM  For on call review [email or web address].

## 2024-01-30 NOTE — Assessment & Plan Note (Signed)
 CPAP if desired

## 2024-01-30 NOTE — Discharge Summary (Signed)
 Physician Discharge Summary   Barbara Pierce  female DOB: 1971/04/22  ZOX:[id number]  PCP: Jadali, Fayegh, MD  Admit date: 01/30/2024 Discharge date: 01/30/2024  Admitted From: home Disposition:  home Husband updated at bedside priro to discharge. CODE STATUS: Full code  Discharge Instructions     Diet Carb Modified   Complete by: As directed       Hospital Course:  For full details, please see H&P, progress notes, consult notes and ancillary notes.  Briefly,  Barbara Pierce is a 53 y.o. female with medical history significant for DM, HTN, obesity, OSA, COPD, recently started plasma donation (2 weeks prior) most recently on the day of arrival, being admitted for syncopal event at home.    Reports that she felt a bit lightheaded after her first plasma donation a couple weeks prior but following that she felt fine.  Following her plasma donation on the day of arrival she was feeling okay and had a busy day going to the nail shop and running errands.   * Syncope Hypotension 2/2 Volume depletion --related to plasma donation and inadequate hydration-this was patient's fifth session.  Pt also has baseline frequent soft BM's related to metformin use.   --Pt received IVF with improvement in BP and orthostatic BP neg prior to discharge.   Sepsis ruled out --no source of infection   Uncontrolled type 2 diabetes mellitus with hyperglycemia, without long-term current use of insulin (HCC) --A1c 9.8.  --Resume home glipizide and metformin after discharge.     Chronic kidney disease, stage 3a (HCC) Renal function appears to be at baseline   Obesity, Class III, BMI 40-49.9 (morbid obesity) Complicating factor to overall prognosis and care   Sleep apnea   COPD (chronic obstructive pulmonary disease) (HCC) Not acutely exacerbated   Hypertension --hold home Lisinopril due to low BP   Discharge Diagnoses:  Principal Problem:   Syncope Active Problems:   SIRS (systemic  inflammatory response syndrome) (HCC)   Uncontrolled type 2 diabetes mellitus with hyperglycemia, without long-term current use of insulin (HCC)   Hypotension   Plasma donor   Hypertension   COPD (chronic obstructive pulmonary disease) (HCC)   Sleep apnea   Obesity, Class III, BMI 40-49.9 (morbid obesity)   Chronic kidney disease, stage 3a (HCC)     Discharge Instructions:  Allergies as of 01/30/2024   No Known Allergies      Medication List     PAUSE taking these medications    lisinopril 20 MG tablet Wait to take this until your doctor or other care provider tells you to start again. Due to low blood pressure. Commonly known as: ZESTRIL Take 40 mg by mouth daily.       STOP taking these medications    albuterol 108 (90 Base) MCG/ACT inhaler Commonly known as: VENTOLIN HFA   ferrous sulfate  325 (65 FE) MG EC tablet   polyethylene glycol 236 g solution Commonly known as: GoLYTELY       TAKE these medications    atorvastatin 20 MG tablet Commonly known as: LIPITOR   EMERGEN-C IMMUNE PO Take by mouth.   glipiZIDE 5 MG 24 hr tablet Commonly known as: GLUCOTROL XL Take 5 mg by mouth daily.   Jencycla 0.35 MG tablet Generic drug: norethindrone   metFORMIN 500 MG tablet Commonly known as: GLUCOPHAGE   ONE TOUCH ULTRA TEST test strip Generic drug: glucose blood   OneTouch Delica Lancets 33G Misc   pantoprazole 40 MG tablet Commonly known as: PROTONIX  Take 40 mg by mouth daily.         Follow-up Information     Jadali, Fayegh, MD Follow up in 1 week(s).   Specialty: Internal Medicine Contact information: [address] [place] [address] [phone]                 No Known Allergies   The results of significant diagnostics from this hospitalization (including imaging, microbiology, ancillary and laboratory) are listed below for reference.   Consultations:   Procedures/Studies: DG Chest Portable 1 View Result Date:  01/30/2024 CLINICAL DATA:  CP, SOB, cough, syncope EXAM: PORTABLE CHEST 1 VIEW COMPARISON:  Chest x-ray 02/13/2020, chest x-ray 10/25/2015 FINDINGS: The heart and mediastinal contours are unchanged. Atherosclerotic plaque. Stable chronic 3 mm right upper lobe peripheral calcified nodule. No focal consolidation. No pulmonary edema. No pleural effusion. No pneumothorax. No acute osseous abnormality. IMPRESSION: 1. No active disease. 2.  Aortic Atherosclerosis (ICD10-I70.0). Electronically Signed   By: Morgane  Naveau M.D.   On: 01/30/2024 01:34      Labs: BNP (last 3 results) No results for input(s): "BNP" in the last 8760 hours. Basic Metabolic Panel: Recent Labs  Lab 01/30/24 0038  NA 134*  K 3.8  CL 103  CO2 21*  GLUCOSE 241*  BUN 22*  CREATININE 1.29*  CALCIUM 8.0*   Liver Function Tests: Recent Labs  Lab 01/30/24 0038  AST 20  ALT 17  ALKPHOS 66  BILITOT 0.6  PROT 6.2*  ALBUMIN 3.1*   No results for input(s): "LIPASE", "AMYLASE" in the last 168 hours. No results for input(s): "AMMONIA" in the last 168 hours. CBC: Recent Labs  Lab 01/30/24 0038  WBC 16.4*  NEUTROABS 8.5*  HGB 12.8  HCT 38.9  MCV 94.4  PLT 289   Cardiac Enzymes: No results for input(s): "CKTOTAL", "CKMB", "CKMBINDEX", "TROPONINI" in the last 168 hours. BNP: Invalid input(s): "POCBNP" CBG: Recent Labs  Lab 01/30/24 0618 01/30/24 0758  GLUCAP 445* 402*   D-Dimer Recent Labs    01/30/24 0038  DDIMER <0.27   Hgb A1c No results for input(s): "HGBA1C" in the last 72 hours. Lipid Profile No results for input(s): "CHOL", "HDL", "LDLCALC", "TRIG", "CHOLHDL", "LDLDIRECT" in the last 72 hours. Thyroid function studies No results for input(s): "TSH", "T4TOTAL", "T3FREE", "THYROIDAB" in the last 72 hours.  Invalid input(s): "FREET3" Anemia work up No results for input(s): "VITAMINB12", "FOLATE", "FERRITIN", "TIBC", "IRON", "RETICCTPCT" in the last 72 hours. Urinalysis    Component Value  Date/Time   COLORURINE STRAW (A) 01/30/2024 0253   APPEARANCEUR CLEAR (A) 01/30/2024 0253   APPEARANCEUR Clear 12/02/2020 1418   LABSPEC 1.006 01/30/2024 0253   PHURINE 6.0 01/30/2024 0253   GLUCOSEU >=500 (A) 01/30/2024 0253   HGBUR NEGATIVE 01/30/2024 0253   BILIRUBINUR NEGATIVE 01/30/2024 0253   BILIRUBINUR Negative 12/02/2020 1418   KETONESUR NEGATIVE 01/30/2024 0253   PROTEINUR NEGATIVE 01/30/2024 0253   NITRITE NEGATIVE 01/30/2024 0253   LEUKOCYTESUR NEGATIVE 01/30/2024 0253   Sepsis Labs Recent Labs  Lab 01/30/24 0038  WBC 16.4*   Microbiology Recent Results (from the past 240 hours)  Resp panel by RT-PCR (RSV, Flu A&B, Covid) Anterior Nasal Swab     Status: None   Collection Time: 01/30/24 12:39 AM   Specimen: Anterior Nasal Swab  Result Value Ref Range Status   SARS Coronavirus 2 by RT PCR NEGATIVE NEGATIVE Final    Comment: (NOTE) SARS-CoV-2 target nucleic acids are NOT DETECTED.  The SARS-CoV-2 RNA is generally  detectable in upper respiratory specimens during the acute phase of infection. The lowest concentration of SARS-CoV-2 viral copies this assay can detect is 138 copies/mL. A negative result does not preclude SARS-Cov-2 infection and should not be used as the sole basis for treatment or other patient management decisions. A negative result may occur with  improper specimen collection/handling, submission of specimen other than nasopharyngeal swab, presence of viral mutation(s) within the areas targeted by this assay, and inadequate number of viral copies(<138 copies/mL). A negative result must be combined with clinical observations, patient history, and epidemiological information. The expected result is Negative.  Fact Sheet for Patients:  BloggerCourse.com  Fact Sheet for Healthcare Providers:  SeriousBroker.it  This test is no t yet approved or cleared by the [place]  FDA and  has been  authorized for detection and/or diagnosis of SARS-CoV-2 by FDA under an Emergency Use Authorization (EUA). This EUA will remain  in effect (meaning this test can be used) for the duration of the COVID-19 declaration under Section 564(b)(1) of the Act, 21 U.S.C.section 360bbb-3(b)(1), unless the authorization is terminated  or revoked sooner.       Influenza A by PCR NEGATIVE NEGATIVE Final   Influenza B by PCR NEGATIVE NEGATIVE Final    Comment: (NOTE) The Xpert Xpress SARS-CoV-2/FLU/RSV plus assay is intended as an aid in the diagnosis of influenza from Nasopharyngeal swab specimens and should not be used as a sole basis for treatment. Nasal washings and aspirates are unacceptable for Xpert Xpress SARS-CoV-2/FLU/RSV testing.  Fact Sheet for Patients: BloggerCourse.com  Fact Sheet for Healthcare Providers: SeriousBroker.it  This test is not yet approved or cleared by the [place]  FDA and has been authorized for detection and/or diagnosis of SARS-CoV-2 by FDA under an Emergency Use Authorization (EUA). This EUA will remain in effect (meaning this test can be used) for the duration of the COVID-19 declaration under Section 564(b)(1) of the Act, 21 U.S.C. section 360bbb-3(b)(1), unless the authorization is terminated or revoked.     Resp Syncytial Virus by PCR NEGATIVE NEGATIVE Final    Comment: (NOTE) Fact Sheet for Patients: BloggerCourse.com  Fact Sheet for Healthcare Providers: SeriousBroker.it  This test is not yet approved or cleared by the [place]  FDA and has been authorized for detection and/or diagnosis of SARS-CoV-2 by FDA under an Emergency Use Authorization (EUA). This EUA will remain in effect (meaning this test can be used) for the duration of the COVID-19 declaration under Section 564(b)(1) of the Act, 21 U.S.C. section 360bbb-3(b)(1), unless the  authorization is terminated or revoked.  Performed at [facility], [address] [place], [place] [address]      Total time spend on discharging this patient, including the last patient exam, discussing the hospital stay, instructions for ongoing care as it relates to all pertinent caregivers, as well as preparing the medical discharge records, prescriptions, and/or referrals as applicable, is 45 minutes.    Garrison Kanner, MD  [facility] 01/30/2024, 9:35 AM

## 2024-01-30 NOTE — Assessment & Plan Note (Addendum)
 Hypotension (orthostatic versus sepsis related or upper) Plasma donor Possibly related to plasma donation and inadequate hydration-this was patient's fifth session Possible recent gastroenteritis.  Had an episode of vomiting a couple days prior and has baseline frequent soft BMs Syncope workup to include continuous cardiac monitoring, echocardiogram and carotid Doppler Hold home antihypertensives IV fluids as needed to support BP and keep MAP over 65 Neurologic checks

## 2024-01-30 NOTE — ED Notes (Signed)
 Requested lab to pull admission labs for pt.  Per lab- they will put on their list.

## 2024-01-30 NOTE — ED Notes (Signed)
Lab in room at this time. 

## 2024-01-30 NOTE — ED Notes (Signed)
 PT CBG 445.  Pt stated that she had a cheeseburger, french fries and a large non diet soda at about 0300 this morning.

## 2024-01-30 NOTE — Assessment & Plan Note (Signed)
 Holding antihypertensives

## 2024-01-31 LAB — HEMOGLOBIN A1C
Hgb A1c MFr Bld: 9.8 % — ABNORMAL HIGH (ref 4.8–5.6)
Mean Plasma Glucose: 235 mg/dL

## 2024-02-04 LAB — CULTURE, BLOOD (ROUTINE X 2)
Culture: NO GROWTH
Culture: NO GROWTH
Special Requests: ADEQUATE
Special Requests: ADEQUATE

## 2024-05-06 ENCOUNTER — Ambulatory Visit
Admission: EM | Admit: 2024-05-06 | Discharge: 2024-05-06 | Disposition: A | Discharge status: Home / Self Care | Payer: PRIVATE HEALTH INSURANCE | Attending: Nurse Practitioner | Admitting: Nurse Practitioner

## 2024-05-06 DIAGNOSIS — B3731 Acute candidiasis of vulva and vagina: Secondary | ICD-10-CM | POA: Diagnosis present

## 2024-05-06 DIAGNOSIS — N3001 Acute cystitis with hematuria: Secondary | ICD-10-CM | POA: Diagnosis present

## 2024-05-06 LAB — URINALYSIS, W/ REFLEX TO CULTURE (INFECTION SUSPECTED)
Bilirubin Urine: NEGATIVE
Glucose, UA: >=500 mg/dL — AB
Nitrite: NEGATIVE
Protein, ur: >300 mg/dL — AB
Specific Gravity, Urine: 1.025 (ref 1.005–1.030)
pH: 6 (ref 5.0–8.0)

## 2024-05-06 MED ORDER — FLUCONAZOLE 150 MG PO TABS: 150.0000 mg | ORAL_TABLET | ORAL | 0 refills | Status: AC

## 2024-05-06 MED ORDER — NITROFURANTOIN MONOHYD MACRO 100 MG PO CAPS: 100.0000 mg | ORAL_CAPSULE | Freq: Two times a day (BID) | ORAL | 0 refills | Status: AC

## 2024-05-06 NOTE — ED Triage Notes (Signed)
 Pt c/o possible UTI x3days  Pt states that she is having burning and itching  Pt denies STD Testing  Pt states that she is having vaginal itching and has used Monistat.

## 2024-05-06 NOTE — ED Provider Notes (Signed)
 [facility]    CSN: [id number] Arrival date & time: 05/06/24  1130      History   Chief Complaint Chief Complaint  Patient presents with   Urinary Tract Infection    HPI Barbara Pierce is a 53 y.o. female.   Discussed the use of AI scribe software for clinical note transcription with the patient, who gave verbal consent to proceed.   The patient presents with concerns of a urinary tract infection. Her primary symptom is vaginal burning and itching, which started 3 days ago. She describes her vagina as on fire and reports both burning and itching sensations. The patient also experiences urinary frequency and urgency. She denies dysuria, discharge or odor. The patient is postmenopausal and sexually active with her husband only. She denies any concerns for STDs.   The following portions of the patient's history were reviewed and updated as appropriate: allergies, current medications, past family history, past medical history, past social history, past surgical history, and problem list.    Past Medical History:  Diagnosis Date   Anemia    Colon polyps    COPD (chronic obstructive pulmonary disease) (HCC)    Diabetes mellitus without complication (HCC)    GERD (gastroesophageal reflux disease)    High cholesterol    Hypertension    Sleep apnea     Patient Active Problem List   Diagnosis Date Noted   Syncope 01/30/2024   SIRS (systemic inflammatory response syndrome) (HCC) 01/30/2024   Hypotension 01/30/2024   Plasma donor 01/30/2024   Obesity, Class III, BMI 40-49.9 (morbid obesity) 01/30/2024   Uncontrolled type 2 diabetes mellitus with hyperglycemia, without long-term current use of insulin  (HCC) 01/30/2024   Chronic kidney disease, stage 3a (HCC) 01/30/2024   Diabetes mellitus without complication (HCC)    Hypertension    COPD (chronic obstructive pulmonary disease) (HCC)    Sleep apnea    Impingement syndrome of shoulder region 08/05/2016    Past  Surgical History:  Procedure Laterality Date   COLONOSCOPY N/A 12/16/2020   Procedure: COLONOSCOPY;  Surgeon: Therisa Bi, MD;  Location: [facility] ENDOSCOPY;  Service: Gastroenterology;  Laterality: N/A;   COLONOSCOPY WITH PROPOFOL  N/A 01/19/2017   Procedure: COLONOSCOPY WITH PROPOFOL ;  Surgeon: Bi Therisa, MD;  Location: [facility] ENDOSCOPY;  Service: Endoscopy;  Laterality: N/A;   DILATION AND CURETTAGE OF UTERUS     ESOPHAGOGASTRODUODENOSCOPY N/A 12/16/2020   Procedure: ESOPHAGOGASTRODUODENOSCOPY (EGD);  Surgeon: Therisa Bi, MD;  Location: [facility] ENDOSCOPY;  Service: Gastroenterology;  Laterality: N/A;   FLEXIBLE SIGMOIDOSCOPY N/A 07/08/2017   Procedure: FLEXIBLE SIGMOIDOSCOPY;  Surgeon: Therisa Bi, MD;  Location: [facility] ENDOSCOPY;  Service: Gastroenterology;  Laterality: N/A;   surgery right forearm      OB History   No obstetric history on file.      Home Medications    Prior to Admission medications   Medication Sig Start Date End Date Taking? Authorizing Provider  atorvastatin (LIPITOR) 20 MG tablet  12/07/16  Yes [provider]  fluconazole  (DIFLUCAN ) 150 MG tablet Take 1 tablet (150 mg total) by mouth every 3 (three) days for 2 doses. 05/06/24 05/10/24 Yes Iola Lukes, FNP  glipiZIDE (GLUCOTROL XL) 5 MG 24 hr tablet Take 5 mg by mouth daily. 04/27/17  Yes [provider]  lisinopril (PRINIVIL,ZESTRIL) 20 MG tablet Take 40 mg by mouth daily. 01/28/17  Yes [provider]  metFORMIN (GLUCOPHAGE) 500 MG tablet  12/07/16  Yes [provider]  Multiple Vitamins-Minerals (EMERGEN-C IMMUNE PO) Take by mouth.  Yes [provider]  nitrofurantoin , macrocrystal-monohydrate, (MACROBID ) 100 MG capsule Take 1 capsule (100 mg total) by mouth 2 (two) times daily. 05/06/24  Yes Iola Lukes, FNP  ONE TOUCH ULTRA TEST test strip  12/08/17  Yes [provider]  AISHA PASTOR LANCETS 33G MISC  12/08/17  Yes [provider]  pantoprazole  (PROTONIX) 40 MG tablet Take 40 mg by mouth daily. 04/27/17  Yes [provider]  JENCYCLA 0.35 MG tablet  05/04/17   [provider]    Family History History reviewed. No pertinent family history.  Social History Social History   Tobacco Use   Smoking status: Every Day    Current packs/day: 0.00    Average packs/day: 1 pack/day for 30.0 years (30.0 ttl pk-yrs)    Types: Cigarettes    Start date: 11/24/1987    Last attempt to quit: 11/23/2017    Years since quitting: 6.4   Smokeless tobacco: Never  Vaping Use   Vaping status: Never Used  Substance Use Topics   Alcohol use: Yes    Comment: occassional, less than once a week   Drug use: No     Allergies   Patient has no known allergies.   Review of Systems Review of Systems  Genitourinary:  Positive for frequency and urgency. Negative for dysuria and vaginal discharge.       Vaginal itching and irritation. No odor      Physical Exam Triage Vital Signs ED Triage Vitals  Encounter Vitals Group     BP 05/06/24 1151 (!) 162/93     Girls Systolic BP Percentile --      Girls Diastolic BP Percentile --      Boys Systolic BP Percentile --      Boys Diastolic BP Percentile --      Pulse Rate 05/06/24 1151 88     Resp --      Temp 05/06/24 1151 98.1 F (36.7 C)     Temp Source 05/06/24 1151 Oral     SpO2 05/06/24 1151 96 %     Weight 05/06/24 1153 184 lb (83.5 kg)     Height 05/06/24 1153 4' 9 (1.448 m)     Head Circumference --      Peak Flow --      Pain Score 05/06/24 1152 0     Pain Loc --      Pain Education --      Exclude from Growth Chart --    No data found.  Updated Vital Signs BP (!) 162/93 (BP Location: Left Arm)   Pulse 88   Temp 98.1 F (36.7 C) (Oral)   Ht 4' 9 (1.448 m)   Wt 184 lb (83.5 kg)   LMP 12/10/2020   SpO2 96%   BMI 39.82 kg/m   Visual Acuity Right Eye Distance:   Left Eye Distance:   Bilateral Distance:    Right Eye Near:   Left Eye Near:    Bilateral  Near:     Physical Exam Constitutional:      General: She is not in acute distress.    Appearance: Normal appearance. She is not ill-appearing, toxic-appearing or diaphoretic.  HENT:     Head: Normocephalic.     Nose: Nose normal.     Mouth/Throat:     Mouth: Mucous membranes are moist.  Eyes:     Conjunctiva/sclera: Conjunctivae normal.  Cardiovascular:     Rate and Rhythm: Normal rate.  Pulmonary:  Effort: Pulmonary effort is normal.  Abdominal:     Palpations: Abdomen is soft.  Genitourinary:    Comments: Deferred; patient performed self-swab for Aptima testing  Musculoskeletal:        General: Normal range of motion.     Cervical back: Normal range of motion and neck supple.  Skin:    General: Skin is warm and dry.  Neurological:     General: No focal deficit present.     Mental Status: She is alert and oriented to person, place, and time.  Psychiatric:        Mood and Affect: Mood normal.        Behavior: Behavior normal.      UC Treatments / Results  Labs (all labs ordered are listed, but only abnormal results are displayed) Labs Reviewed  URINALYSIS, W/ REFLEX TO CULTURE (INFECTION SUSPECTED) - Abnormal; Notable for the following components:      Result Value   APPearance CLOUDY (*)    Glucose, UA >=500 (*)    Hgb urine dipstick TRACE (*)    Ketones, ur TRACE (*)    Protein, ur >300 (*)    Leukocytes,Ua TRACE (*)    Bacteria, UA MANY (*)    All other components within normal limits  URINE CULTURE  CERVICOVAGINAL ANCILLARY ONLY    EKG   Radiology No results found.  Procedures Procedures (including critical care time)  Medications Ordered in UC Medications - No data to display  Initial Impression / Assessment and Plan / UC Course  I have reviewed the triage vital signs and the nursing notes.  Pertinent labs & imaging results that were available during my care of the patient were reviewed by me and considered in my medical decision making  (see chart for details).     The patient presents with a three-day history of urinary frequency and urgency. Urinalysis revealed leukocytes and bacteria, consistent with a urinary tract infection. Additionally, the patient reports vaginal burning and itching for the same duration, and urinalysis showed the presence of yeast, suggesting a concurrent vaginal yeast infection. Nitrofurantoin  was prescribed twice daily for 5 days, and urine was sent for culture to confirm antibiotic selection. Diflucan  was prescribed, with one dose to be taken immediately and another in two days. The patient was advised to increase fluid intake and will be notified of any additional findings from the urine culture or vaginal swab. Follow-up with a primary care provider was recommended if symptoms do not improve, and emergency evaluation was advised for fever, flank pain, worsening urinary symptoms, or new concerning symptoms.  Today's evaluation has revealed no signs of a dangerous process. Discussed diagnosis with patient and/or guardian. Patient and/or guardian aware of their diagnosis, possible red flag symptoms to watch out for and need for close follow up. Patient and/or guardian understands verbal and written discharge instructions. Patient and/or guardian comfortable with plan and disposition.  Patient and/or guardian has a clear mental status at this time, good insight into illness (after discussion and teaching) and has clear judgment to make decisions regarding their care  Documentation was completed with the aid of voice recognition software. Transcription may contain typographical errors. Final Clinical Impressions(s) / UC Diagnoses   Final diagnoses:  Acute cystitis with hematuria  Vaginal yeast infection     Discharge Instructions      You were seen today for symptoms consistent with a urinary tract infection and a vaginal yeast infection. Your urinalysis showed bacteria and white blood cells,  which  supports the UTI diagnosis, and also showed yeast, which can cause vaginal burning and itching. You have been prescribed nitrofurantoin  to take twice daily for 5 days for the UTI and Diflucan  to take now and again in 2 days for the yeast infection. Take all medications exactly as prescribed and finish the antibiotic even if you feel better. Drink plenty of fluids to help flush bacteria from your urinary tract, and avoid caffeine, alcohol, and sugary drinks, which can irritate the bladder. For yeast infection relief, you may wear loose-fitting cotton underwear, avoid scented soaps or feminine hygiene products, and keep the area clean and dry. You will be contacted if your urine culture or vaginal swab results show anything that requires a change in treatment. Follow up with your primary care provider if symptoms do not improve within a few days or return after treatment. Go to the emergency department if you develop a fever, flank or back pain, worsening urinary symptoms, severe pelvic pain, vomiting, or any new concerning symptoms.      ED Prescriptions     Medication Sig Dispense Auth. Provider   nitrofurantoin , macrocrystal-monohydrate, (MACROBID ) 100 MG capsule Take 1 capsule (100 mg total) by mouth 2 (two) times daily. 10 capsule Iola Lukes, FNP   fluconazole  (DIFLUCAN ) 150 MG tablet Take 1 tablet (150 mg total) by mouth every 3 (three) days for 2 doses. 2 tablet Iola Lukes, FNP      PDMP not reviewed this encounter.   Iola Lukes, [place] 05/06/24 [id number]

## 2024-05-06 NOTE — Discharge Instructions (Addendum)
 You were seen today for symptoms consistent with a urinary tract infection and a vaginal yeast infection. Your urinalysis showed bacteria and white blood cells, which supports the UTI diagnosis, and also showed yeast, which can cause vaginal burning and itching. You have been prescribed nitrofurantoin  to take twice daily for 5 days for the UTI and Diflucan  to take now and again in 2 days for the yeast infection. Take all medications exactly as prescribed and finish the antibiotic even if you feel better. Drink plenty of fluids to help flush bacteria from your urinary tract, and avoid caffeine, alcohol, and sugary drinks, which can irritate the bladder. For yeast infection relief, you may wear loose-fitting cotton underwear, avoid scented soaps or feminine hygiene products, and keep the area clean and dry. You will be contacted if your urine culture or vaginal swab results show anything that requires a change in treatment. Follow up with your primary care provider if symptoms do not improve within a few days or return after treatment. Go to the emergency department if you develop a fever, flank or back pain, worsening urinary symptoms, severe pelvic pain, vomiting, or any new concerning symptoms.

## 2024-05-07 LAB — URINE CULTURE

## 2024-05-08 ENCOUNTER — Ambulatory Visit (HOSPITAL_COMMUNITY): Payer: Self-pay

## 2024-05-08 LAB — CERVICOVAGINAL ANCILLARY ONLY
Bacterial Vaginitis (gardnerella): NEGATIVE
Candida Glabrata: NEGATIVE
Candida Vaginitis: POSITIVE — AB
Comment: NEGATIVE
Comment: NEGATIVE
Comment: NEGATIVE
